# Patient Record
Sex: Female | Born: 1965 | Race: White | Hispanic: No | Marital: Married | Smoking: Never smoker
Health system: Southern US, Community
[De-identification: ages and names within clinical notes are randomized; demographics above are authoritative.]

## PROBLEM LIST (undated history)

## (undated) DIAGNOSIS — T7840XA Allergy, unspecified, initial encounter: Secondary | ICD-10-CM

## (undated) DIAGNOSIS — E079 Disorder of thyroid, unspecified: Secondary | ICD-10-CM

## (undated) HISTORY — DX: Allergy, unspecified, initial encounter: T78.40XA

## (undated) HISTORY — PX: THYROIDECTOMY, PARTIAL: SHX18

## (undated) HISTORY — DX: Disorder of thyroid, unspecified: E07.9

## (undated) HISTORY — PX: DILATION AND CURETTAGE OF UTERUS: SHX78

---

## 1989-10-23 HISTORY — PX: THYROIDECTOMY, PARTIAL: SHX18

## 1998-11-01 ENCOUNTER — Ambulatory Visit (HOSPITAL_COMMUNITY): Admission: RE | Admit: 1998-11-01 | Discharge: 1998-11-01 | Payer: Self-pay | Admitting: Obstetrics and Gynecology

## 1999-02-03 ENCOUNTER — Other Ambulatory Visit: Admission: RE | Admit: 1999-02-03 | Discharge: 1999-02-03 | Payer: Self-pay | Admitting: Obstetrics and Gynecology

## 1999-06-13 ENCOUNTER — Inpatient Hospital Stay (HOSPITAL_COMMUNITY): Admission: AD | Admit: 1999-06-13 | Discharge: 1999-06-17 | Payer: Self-pay | Admitting: Obstetrics and Gynecology

## 1999-06-16 ENCOUNTER — Encounter: Payer: Self-pay | Admitting: Obstetrics and Gynecology

## 1999-08-23 ENCOUNTER — Inpatient Hospital Stay (HOSPITAL_COMMUNITY): Admission: AD | Admit: 1999-08-23 | Discharge: 1999-08-24 | Payer: Self-pay | Admitting: Obstetrics and Gynecology

## 1999-09-28 ENCOUNTER — Other Ambulatory Visit: Admission: RE | Admit: 1999-09-28 | Discharge: 1999-09-28 | Payer: Self-pay | Admitting: Obstetrics and Gynecology

## 2004-08-25 ENCOUNTER — Other Ambulatory Visit: Admission: RE | Admit: 2004-08-25 | Discharge: 2004-08-25 | Payer: Self-pay | Admitting: Family Medicine

## 2005-09-12 ENCOUNTER — Other Ambulatory Visit: Admission: RE | Admit: 2005-09-12 | Discharge: 2005-09-12 | Payer: Self-pay | Admitting: Family Medicine

## 2006-09-24 ENCOUNTER — Other Ambulatory Visit: Admission: RE | Admit: 2006-09-24 | Discharge: 2006-09-24 | Payer: Self-pay | Admitting: Family Medicine

## 2011-01-19 LAB — HM MAMMOGRAPHY: HM Mammogram: NORMAL

## 2011-01-26 LAB — HM PAP SMEAR: HM Pap smear: ABNORMAL

## 2011-02-01 ENCOUNTER — Encounter: Payer: Self-pay | Admitting: *Deleted

## 2013-05-05 ENCOUNTER — Other Ambulatory Visit (INDEPENDENT_AMBULATORY_CARE_PROVIDER_SITE_OTHER): Payer: BC Managed Care – PPO

## 2013-05-05 DIAGNOSIS — E039 Hypothyroidism, unspecified: Secondary | ICD-10-CM

## 2013-05-05 NOTE — Progress Notes (Signed)
Patient came in for labs only.

## 2013-05-06 ENCOUNTER — Other Ambulatory Visit: Payer: Self-pay

## 2013-05-06 LAB — THYROID PANEL WITH TSH
Free Thyroxine Index: 3.5 (ref 1.0–3.9)
T3 Uptake: 27.7 % (ref 22.5–37.0)
T4, Total: 12.8 ug/dL — ABNORMAL HIGH (ref 5.0–12.5)
TSH: 1.464 u[IU]/mL (ref 0.350–4.500)

## 2013-05-22 ENCOUNTER — Encounter: Payer: Self-pay | Admitting: Nurse Practitioner

## 2013-05-22 ENCOUNTER — Ambulatory Visit (INDEPENDENT_AMBULATORY_CARE_PROVIDER_SITE_OTHER): Payer: BC Managed Care – PPO | Admitting: Nurse Practitioner

## 2013-05-22 VITALS — BP 109/72 | HR 78 | Temp 97.3°F | Ht 65.5 in | Wt 187.0 lb

## 2013-05-22 DIAGNOSIS — E039 Hypothyroidism, unspecified: Secondary | ICD-10-CM

## 2013-05-22 MED ORDER — LEVOTHYROXINE SODIUM 100 MCG PO TABS: 100.0000 ug | ORAL_TABLET | Freq: Every day | ORAL | Status: DC

## 2013-05-22 NOTE — Progress Notes (Signed)
  Subjective:    Patient ID: Leah May, female    DOB: [birth year], 47 y.o.   MRN: [id number]  Thyroid Problem Presents for follow-up (hypothyroidism) visit. Patient reports no anxiety, constipation, depressed mood, diaphoresis, diarrhea, dry skin, hair loss, heat intolerance, menstrual problem, nail problem, visual change, weight gain or weight loss. The symptoms have been stable.      Review of Systems  Constitutional: Negative for weight loss, weight gain and diaphoresis.  Gastrointestinal: Negative for diarrhea and constipation.  Endocrine: Negative for heat intolerance.  Genitourinary: Negative for menstrual problem.  All other systems reviewed and are negative.       Objective:   Physical Exam  Constitutional: She is oriented to person, place, and time. She appears well-developed and well-nourished.  HENT:  Nose: Nose normal.  Mouth/Throat: Oropharynx is clear and moist.  Eyes: EOM are normal.  Neck: Trachea normal, normal range of motion and full passive range of motion without pain. Neck supple. No JVD present. Carotid bruit is not present. No thyromegaly present.  Cardiovascular: Normal rate, regular rhythm, normal heart sounds and intact distal pulses.  Exam reveals no gallop and no friction rub.   No murmur heard. Pulmonary/Chest: Effort normal and breath sounds normal.  Abdominal: Soft. Bowel sounds are normal. She exhibits no distension and no mass. There is no tenderness.  Musculoskeletal: Normal range of motion.  Lymphadenopathy:    She has no cervical adenopathy.  Neurological: She is alert and oriented to person, place, and time. She has normal reflexes.  Skin: Skin is warm and dry.  Psychiatric: She has a normal mood and affect. Her behavior is normal. Judgment and thought content normal.    BP 109/72  Pulse 78  Temp(Src) 97.3 F (36.3 C) (Oral)  Ht 5' 5.5" (1.664 m)  Wt 187 lb (84.823 kg)  BMI 30.63 kg/m2  Results for orders placed in visit on  05/05/13  THYROID PANEL WITH TSH      Result Value Range   T4, Total 12.8 (*) 5.0 - 12.5 ug/dL   T3 Uptake 11.9  14.7 - 37.0 %   Free Thyroxine Index 3.5  1.0 - 3.9   TSH 1.464  0.350 - 4.500 uIU/mL        Assessment & Plan:  1. Hypothyroidism Continue current meds Diet and exercise Follow up in 6 months  Mary-Margaret Daphine Deutscher, FNP

## 2013-05-22 NOTE — Addendum Note (Signed)
Addended by: Bennie Pierini on: 05/22/2013 11:46 AM   Modules accepted: Orders

## 2013-05-22 NOTE — Patient Instructions (Signed)

## 2013-05-26 ENCOUNTER — Encounter: Payer: Self-pay | Admitting: *Deleted

## 2013-07-09 ENCOUNTER — Encounter: Payer: Self-pay | Admitting: Nurse Practitioner

## 2013-07-09 ENCOUNTER — Ambulatory Visit (INDEPENDENT_AMBULATORY_CARE_PROVIDER_SITE_OTHER): Payer: BC Managed Care – PPO | Admitting: Nurse Practitioner

## 2013-07-09 VITALS — BP 114/72 | HR 91 | Temp 99.2°F | Ht 65.5 in | Wt 180.5 lb

## 2013-07-09 DIAGNOSIS — Z0000 Encounter for general adult medical examination without abnormal findings: Secondary | ICD-10-CM

## 2013-07-09 DIAGNOSIS — Z01419 Encounter for gynecological examination (general) (routine) without abnormal findings: Secondary | ICD-10-CM

## 2013-07-09 NOTE — Patient Instructions (Signed)

## 2013-07-09 NOTE — Progress Notes (Signed)
  Subjective:    Patient ID: Leah May, female    DOB: Dec 04, 1965, 47 y.o.   MRN: [id number]  HPI  Patient in today for CPE and PAP- she is doing quite well . Only current medical problem is Hypothyroidism an d she is doing well on current dose.    Review of Systems  All other systems reviewed and are negative.       Objective:   Physical Exam  Constitutional: She is oriented to person, place, and time. She appears well-developed and well-nourished.  HENT:  Head: Normocephalic.  Right Ear: Hearing, tympanic membrane, external ear and ear canal normal.  Left Ear: Hearing, tympanic membrane, external ear and ear canal normal.  Nose: Nose normal.  Mouth/Throat: Uvula is midline and oropharynx is clear and moist.  Eyes: Conjunctivae and EOM are normal. Pupils are equal, round, and reactive to light.  Neck: Normal range of motion and full passive range of motion without pain. Neck supple. No JVD present. Carotid bruit is not present. No mass and no thyromegaly present.  Cardiovascular: Normal rate, normal heart sounds and intact distal pulses.   No murmur heard. Pulmonary/Chest: Effort normal and breath sounds normal. Right breast exhibits no inverted nipple, no mass, no nipple discharge, no skin change and no tenderness. Left breast exhibits no inverted nipple, no mass, no nipple discharge, no skin change and no tenderness.  Abdominal: Soft. Bowel sounds are normal. She exhibits no mass. There is no tenderness.  Genitourinary: Vagina normal and uterus normal. No breast swelling, tenderness, discharge or bleeding.  bimanual exam-No adnexal masses or tenderness.  Cervix parous and pink no discharge   Musculoskeletal: Normal range of motion.  Lymphadenopathy:    She has no cervical adenopathy.  Neurological: She is alert and oriented to person, place, and time.  Skin: Skin is warm and dry.  Psychiatric: She has a normal mood and affect. Her behavior is normal. Judgment and thought  content normal.   BP 114/72  Pulse 91  Temp(Src) 99.2 F (37.3 C) (Oral)  Ht 5' 5.5" (1.664 m)  Wt 180 lb 8 oz (81.874 kg)  BMI 29.57 kg/m2        Assessment & Plan:  1. Annual physical exam Low fat diet and exercise encouraged  2. Encounter for routine gynecological examination - Pap IG w/ reflex to HPV when ASC-U  Continue current meds Labs reviewed at appointment  Mary-Margaret Daphine Deutscher, FNP

## 2013-07-14 LAB — PAP IG W/ RFLX HPV ASCU: PAP Smear Comment: 0

## 2013-07-25 ENCOUNTER — Telehealth: Payer: Self-pay | Admitting: Nurse Practitioner

## 2013-07-25 NOTE — Telephone Encounter (Signed)
Pt aware.

## 2013-07-28 ENCOUNTER — Ambulatory Visit (INDEPENDENT_AMBULATORY_CARE_PROVIDER_SITE_OTHER): Payer: BC Managed Care – PPO

## 2013-07-28 DIAGNOSIS — Z23 Encounter for immunization: Secondary | ICD-10-CM

## 2013-08-21 ENCOUNTER — Other Ambulatory Visit: Payer: Self-pay | Admitting: Nurse Practitioner

## 2013-08-21 DIAGNOSIS — R928 Other abnormal and inconclusive findings on diagnostic imaging of breast: Secondary | ICD-10-CM

## 2014-05-13 ENCOUNTER — Other Ambulatory Visit (INDEPENDENT_AMBULATORY_CARE_PROVIDER_SITE_OTHER): Payer: BC Managed Care – PPO

## 2014-05-13 DIAGNOSIS — E039 Hypothyroidism, unspecified: Secondary | ICD-10-CM

## 2014-05-14 LAB — THYROID PANEL WITH TSH
Free Thyroxine Index: 2.3 (ref 1.2–4.9)
T3 Uptake Ratio: 18 % — ABNORMAL LOW (ref 24–39)
T4, Total: 13 ug/dL — ABNORMAL HIGH (ref 4.5–12.0)
TSH: 1.78 u[IU]/mL (ref 0.450–4.500)

## 2014-05-15 ENCOUNTER — Telehealth: Payer: Self-pay | Admitting: Family Medicine

## 2014-05-18 NOTE — Telephone Encounter (Signed)
labs

## 2014-06-23 ENCOUNTER — Other Ambulatory Visit: Payer: Self-pay | Admitting: *Deleted

## 2014-06-23 MED ORDER — LEVOTHYROXINE SODIUM 100 MCG PO TABS: 100.0000 ug | ORAL_TABLET | Freq: Every day | ORAL | Status: DC

## 2014-07-15 ENCOUNTER — Other Ambulatory Visit: Payer: BC Managed Care – PPO | Admitting: Nurse Practitioner

## 2014-07-29 ENCOUNTER — Telehealth: Payer: Self-pay | Admitting: Nurse Practitioner

## 2014-07-29 NOTE — Telephone Encounter (Signed)
ntbs for refills of meds

## 2014-07-29 NOTE — Telephone Encounter (Signed)
Please advise 

## 2014-07-29 NOTE — Telephone Encounter (Signed)
Patient requesting that meds be sent to [facility]. Has not had any labs or been seen since 07-09-13. Please advise on 90 day supply

## 2014-07-29 NOTE — Telephone Encounter (Signed)
error 

## 2014-07-30 ENCOUNTER — Telehealth: Payer: Self-pay | Admitting: *Deleted

## 2014-07-30 NOTE — Telephone Encounter (Signed)
Message given,need to schedule an appointment for check up and medication refills.  New scripts can be sent to mail order at that time.

## 2014-08-07 ENCOUNTER — Other Ambulatory Visit: Payer: Self-pay

## 2014-08-20 ENCOUNTER — Ambulatory Visit: Payer: BC Managed Care – PPO

## 2014-09-15 ENCOUNTER — Telehealth: Payer: Self-pay | Admitting: Nurse Practitioner

## 2014-09-16 NOTE — Telephone Encounter (Signed)
Brand name

## 2014-09-18 MED ORDER — LEVOTHYROXINE SODIUM 100 MCG PO TABS: 100.0000 ug | ORAL_TABLET | Freq: Every day | ORAL | Status: DC

## 2014-09-25 ENCOUNTER — Other Ambulatory Visit: Payer: Self-pay | Admitting: *Deleted

## 2014-09-25 MED ORDER — LEVOTHYROXINE SODIUM 100 MCG PO TABS: 100.0000 ug | ORAL_TABLET | Freq: Every day | ORAL | Status: DC

## 2014-10-01 ENCOUNTER — Ambulatory Visit (INDEPENDENT_AMBULATORY_CARE_PROVIDER_SITE_OTHER): Payer: BC Managed Care – PPO | Admitting: Nurse Practitioner

## 2014-10-01 ENCOUNTER — Encounter: Payer: Self-pay | Admitting: Nurse Practitioner

## 2014-10-01 VITALS — BP 131/83 | HR 80 | Temp 97.1°F | Ht 65.0 in | Wt 188.0 lb

## 2014-10-01 DIAGNOSIS — E039 Hypothyroidism, unspecified: Secondary | ICD-10-CM

## 2014-10-01 MED ORDER — LEVOTHYROXINE SODIUM 100 MCG PO TABS: 100.0000 ug | ORAL_TABLET | Freq: Every day | ORAL | Status: DC

## 2014-10-01 NOTE — Progress Notes (Signed)
  Subjective:    Patient ID: Leah May, female    DOB: June 04, 1966, 48 y.o.   MRN: [id number]  Thyroid Problem Presents for follow-up visit. Patient reports no constipation, diaphoresis, diarrhea, heat intolerance, menstrual problem or visual change. The symptoms have been stable.      Review of Systems  Constitutional: Negative for diaphoresis.  Respiratory: Negative.   Cardiovascular: Negative.   Gastrointestinal: Negative for diarrhea and constipation.  Endocrine: Negative for heat intolerance.  Genitourinary: Negative for menstrual problem.  Neurological: Negative.   Psychiatric/Behavioral: Negative.   All other systems reviewed and are negative.      Objective:   Physical Exam  Constitutional: She is oriented to person, place, and time. She appears well-developed and well-nourished.  HENT:  Nose: Nose normal.  Mouth/Throat: Oropharynx is clear and moist.  Eyes: EOM are normal.  Neck: Trachea normal, normal range of motion and full passive range of motion without pain. Neck supple. No JVD present. Carotid bruit is not present. No thyromegaly present.  Cardiovascular: Normal rate, regular rhythm, normal heart sounds and intact distal pulses.  Exam reveals no gallop and no friction rub.   No murmur heard. Pulmonary/Chest: Effort normal and breath sounds normal.  Abdominal: Soft. Bowel sounds are normal. She exhibits no distension and no mass. There is no tenderness.  Musculoskeletal: Normal range of motion.  Lymphadenopathy:    She has no cervical adenopathy.  Neurological: She is alert and oriented to person, place, and time. She has normal reflexes.  Skin: Skin is warm and dry.  Psychiatric: She has a normal mood and affect. Her behavior is normal. Judgment and thought content normal.    BP 131/83 mmHg  Pulse 80  Temp(Src) 97.1 F (36.2 C) (Oral)  Ht 5\' 5"  (1.651 m)  Wt 188 lb (85.276 kg)  BMI 31.28 kg/m2        Assessment & Plan:  1.  Hypothyroidism Continue current meds Diet and exercise Follow up in 6 months  Mary-Margaret Hassell Done, FNP

## 2014-10-01 NOTE — Patient Instructions (Signed)
Hypothyroidism The thyroid is a large gland located in the lower front of your neck. The thyroid gland helps control metabolism. Metabolism is how your body handles food. It controls metabolism with the hormone thyroxine. When this gland is underactive (hypothyroid), it produces too little hormone.  CAUSES These include:   Absence or destruction of thyroid tissue.  Goiter due to iodine deficiency.  Goiter due to medications.  Congenital defects (since birth).  Problems with the pituitary. This causes a lack of TSH (thyroid stimulating hormone). This hormone tells the thyroid to turn out more hormone. SYMPTOMS  Lethargy (feeling as though you have no energy)  Cold intolerance  Weight gain (in spite of normal food intake)  Dry skin  Coarse hair  Menstrual irregularity (if severe, may lead to infertility)  Slowing of thought processes Cardiac problems are also caused by insufficient amounts of thyroid hormone. Hypothyroidism in the newborn is cretinism, and is an extreme form. It is important that this form be treated adequately and immediately or it will lead rapidly to retarded physical and mental development. DIAGNOSIS  To prove hypothyroidism, your caregiver may do blood tests and ultrasound tests. Sometimes the signs are hidden. It may be necessary for your caregiver to watch this illness with blood tests either before or after diagnosis and treatment. TREATMENT  Low levels of thyroid hormone are increased by using synthetic thyroid hormone. This is a safe, effective treatment. It usually takes about four weeks to gain the full effects of the medication. After you have the full effect of the medication, it will generally take another four weeks for problems to leave. Your caregiver may start you on low doses. If you have had heart problems the dose may be gradually increased. It is generally not an emergency to get rapidly to normal. HOME CARE INSTRUCTIONS   Take your  medications as your caregiver suggests. Let your caregiver know of any medications you are taking or start taking. Your caregiver will help you with dosage schedules.  As your condition improves, your dosage needs may increase. It will be necessary to have continuing blood tests as suggested by your caregiver.  Report all suspected medication side effects to your caregiver. SEEK MEDICAL CARE IF: Seek medical care if you develop:  Sweating.  Tremulousness (tremors).  Anxiety.  Rapid weight loss.  Heat intolerance.  Emotional swings.  Diarrhea.  Weakness. SEEK IMMEDIATE MEDICAL CARE IF:  You develop chest pain, an irregular heart beat (palpitations), or a rapid heart beat. MAKE SURE YOU:   Understand these instructions.  Will watch your condition.  Will get help right away if you are not doing well or get worse. Document Released: 10/09/2005 Document Revised: 01/01/2012 Document Reviewed: 05/29/2008 Exit[employer] Patient Information 2015 [employer]. This information is not intended to replace advice given to you by your health care provider. Make sure you discuss any questions you have with your health care provider.  

## 2014-10-02 LAB — NMR, LIPOPROFILE
CHOLESTEROL: 179 mg/dL (ref 100–199)
HDL CHOLESTEROL BY NMR: 73 mg/dL (ref >39)
HDL PARTICLE NUMBER: 38.1 umol/L (ref >=30.5)
LDL Particle Number: 804 nmol/L (ref <1000)
LDL Size: 20.7 nm (ref >20.5)
LDL-C: 93 mg/dL (ref 0–99)
LP-IR Score: <25 (ref <=45)
SMALL LDL PARTICLE NUMBER: 232 nmol/L (ref <=527)
TRIGLYCERIDES BY NMR: 63 mg/dL (ref 0–149)

## 2014-10-02 LAB — CMP14+EGFR
A/G RATIO: 1.7 (ref 1.1–2.5)
ALBUMIN: 4.3 g/dL (ref 3.5–5.5)
ALT: 17 IU/L (ref 0–32)
AST: 18 IU/L (ref 0–40)
Alkaline Phosphatase: 57 IU/L (ref 39–117)
BUN/Creatinine Ratio: 22 (ref 9–23)
BUN: 13 mg/dL (ref 6–24)
CO2: 22 mmol/L (ref 18–29)
CREATININE: 0.58 mg/dL (ref 0.57–1.00)
Calcium: 10.5 mg/dL — ABNORMAL HIGH (ref 8.7–10.2)
Chloride: 100 mmol/L (ref 97–108)
GFR calc Af Amer: 128 mL/min/{1.73_m2} (ref >59)
GFR, EST NON AFRICAN AMERICAN: 111 mL/min/{1.73_m2} (ref >59)
GLOBULIN, TOTAL: 2.5 g/dL (ref 1.5–4.5)
GLUCOSE: 101 mg/dL — AB (ref 65–99)
Potassium: 4.1 mmol/L (ref 3.5–5.2)
Sodium: 138 mmol/L (ref 134–144)
TOTAL PROTEIN: 6.8 g/dL (ref 6.0–8.5)
Total Bilirubin: 0.4 mg/dL (ref 0.0–1.2)

## 2015-09-06 ENCOUNTER — Ambulatory Visit (INDEPENDENT_AMBULATORY_CARE_PROVIDER_SITE_OTHER): Payer: BC Managed Care – PPO

## 2015-09-06 ENCOUNTER — Other Ambulatory Visit: Payer: Self-pay | Admitting: Family Medicine

## 2015-09-06 ENCOUNTER — Ambulatory Visit (INDEPENDENT_AMBULATORY_CARE_PROVIDER_SITE_OTHER): Payer: BC Managed Care – PPO | Admitting: Family Medicine

## 2015-09-06 ENCOUNTER — Encounter: Payer: Self-pay | Admitting: Family Medicine

## 2015-09-06 VITALS — BP 121/75 | HR 87 | Temp 99.4°F | Ht 65.0 in | Wt 185.4 lb

## 2015-09-06 DIAGNOSIS — R05 Cough: Secondary | ICD-10-CM

## 2015-09-06 DIAGNOSIS — R0781 Pleurodynia: Secondary | ICD-10-CM | POA: Diagnosis not present

## 2015-09-06 DIAGNOSIS — R059 Cough, unspecified: Secondary | ICD-10-CM

## 2015-09-06 NOTE — Progress Notes (Signed)
BP 121/75 mmHg  Pulse 87  Temp(Src) 99.4 F (37.4 C) (Oral)  Ht 5' 5"  (1.651 m)  Wt 185 lb 6.4 oz (84.097 kg)  BMI 30.85 kg/m2  LMP 08/30/2015 (Approximate)   Subjective:    Patient ID: Leah May, female    DOB: [birth year], 49 y.o.   MRN: [id number]  HPI: Leah May is a 49 y.o. female presenting on 09/06/2015 for Hurting in chest and Cough   HPI Left-sided chest pain Patient has left-sided chest pain with a deep breathing and coughing. He pain has persisted since she has recently gotten over an acute bronchitis. The pain is felt and worsened upon palpation.She denies any shortness of breath. She has residual cough from her current illness but it is improving and almost gone. She has been using Claritin and azelastine spray. They have been helping but not completely getting rid of her postnasal drainage. She denies fevers or chills.  Relevant past medical, surgical, family and social history reviewed and updated as indicated. Interim medical history since our last visit reviewed. Allergies and medications reviewed and updated.  Review of Systems  Constitutional: Negative for fever and chills.  HENT: Positive for postnasal drip and sinus pressure. Negative for congestion, ear discharge, ear pain, rhinorrhea, sneezing and sore throat.   Eyes: Negative for pain, redness and visual disturbance.  Respiratory: Positive for cough. Negative for chest tightness and shortness of breath.   Cardiovascular: Negative for chest pain and leg swelling.  Genitourinary: Negative for dysuria and difficulty urinating.  Musculoskeletal: Negative for back pain and gait problem.  Skin: Negative for rash.  Neurological: Negative for light-headedness and headaches.  Psychiatric/Behavioral: Negative for behavioral problems and agitation.  All other systems reviewed and are negative.   Per HPI unless specifically indicated above     Medication List       This list is accurate as of:  09/06/15  5:30 PM.  Always use your most recent med list.               levothyroxine 100 MCG tablet  Commonly known as:  SYNTHROID, LEVOTHROID  Take 1 tablet (100 mcg total) by mouth daily.           Objective:    BP 121/75 mmHg  Pulse 87  Temp(Src) 99.4 F (37.4 C) (Oral)  Ht 5' 5"  (1.651 m)  Wt 185 lb 6.4 oz (84.097 kg)  BMI 30.85 kg/m2  LMP 08/30/2015 (Approximate)  Wt Readings from Last 3 Encounters:  09/06/15 185 lb 6.4 oz (84.097 kg)  10/01/14 188 lb (85.276 kg)  07/09/13 180 lb 8 oz (81.874 kg)    Physical Exam  Constitutional: She is oriented to person, place, and time. She appears well-developed and well-nourished. No distress.  HENT:  Right Ear: Tympanic membrane, external ear and ear canal normal.  Left Ear: Tympanic membrane, external ear and ear canal normal.  Nose: Mucosal edema and rhinorrhea present. No epistaxis. Right sinus exhibits no maxillary sinus tenderness and no frontal sinus tenderness. Left sinus exhibits no maxillary sinus tenderness and no frontal sinus tenderness.  Mouth/Throat: Uvula is midline and mucous membranes are normal. Posterior oropharyngeal edema and posterior oropharyngeal erythema present. No oropharyngeal exudate or tonsillar abscesses.  Eyes: Conjunctivae and EOM are normal. Pupils are equal, round, and reactive to light.  Neck: Neck supple. No thyromegaly present.  Cardiovascular: Normal rate, regular rhythm, normal heart sounds and intact distal pulses.   No murmur heard. Pulmonary/Chest: Effort normal and breath  sounds normal. No respiratory distress. She has no wheezes.  Musculoskeletal: Normal range of motion. She exhibits tenderness. She exhibits no edema.       Arms: Lymphadenopathy:    She has no cervical adenopathy.  Neurological: She is alert and oriented to person, place, and time. Coordination normal.  Skin: Skin is warm and dry. No rash noted. She is not diaphoretic.  Psychiatric: She has a normal mood and  affect. Her behavior is normal.  Nursing note and vitals reviewed.   Results for orders placed or performed in visit on 10/01/14  CMP14+EGFR  Result Value Ref Range   Glucose 101 (H) 65 - 99 mg/dL   BUN 13 6 - 24 mg/dL   Creatinine, Ser 0.58 0.57 - 1.00 mg/dL   GFR calc non Af Amer 111 >59 mL/min/1.73   GFR calc Af Amer 128 >59 mL/min/1.73   BUN/Creatinine Ratio 22 9 - 23   Sodium 138 134 - 144 mmol/L   Potassium 4.1 3.5 - 5.2 mmol/L   Chloride 100 97 - 108 mmol/L   CO2 22 18 - 29 mmol/L   Calcium 10.5 (H) 8.7 - 10.2 mg/dL   Total Protein 6.8 6.0 - 8.5 g/dL   Albumin 4.3 3.5 - 5.5 g/dL   Globulin, Total 2.5 1.5 - 4.5 g/dL   Albumin/Globulin Ratio 1.7 1.1 - 2.5   Total Bilirubin 0.4 0.0 - 1.2 mg/dL   Alkaline Phosphatase 57 39 - 117 IU/L   AST 18 0 - 40 IU/L   ALT 17 0 - 32 IU/L  NMR, lipoprofile  Result Value Ref Range   LDL Particle Number [id number] <1000 nmol/L   LDL-C 93 0 - 99 mg/dL   HDL Cholesterol by NMR 73 >39 mg/dL   Triglycerides by NMR 63 0 - 149 mg/dL   Cholesterol 179 100 - 199 mg/dL   HDL Particle Number 38.1 >=30.5 umol/L   Small LDL Particle Number 232 <=527 nmol/L   LDL Size 20.7 >20.5 nm   LP-IR Score <25 <=45      Assessment & Plan:   Problem List Items Addressed This Visit    None    Visit Diagnoses    Rib pain on left side    -  Primary    Patient has been having cough and has left-sided sharp rib pain. Recommended anti-inflammatories and massage with tennis ball        Follow up plan: Return if symptoms worsen or fail to improve.  Counseling provided for all of the vaccine components No orders of the defined types were placed in this encounter.    Caryl Pina, MD [facility] 09/06/2015, 5:30 PM

## 2015-09-14 ENCOUNTER — Ambulatory Visit (INDEPENDENT_AMBULATORY_CARE_PROVIDER_SITE_OTHER): Payer: BC Managed Care – PPO | Admitting: *Deleted

## 2015-09-14 DIAGNOSIS — Z23 Encounter for immunization: Secondary | ICD-10-CM

## 2015-09-19 ENCOUNTER — Other Ambulatory Visit: Payer: Self-pay | Admitting: Nurse Practitioner

## 2015-09-20 NOTE — Telephone Encounter (Signed)
Last refill without being seen 

## 2015-09-20 NOTE — Telephone Encounter (Signed)
Last TSH 04/2014 

## 2015-09-20 NOTE — Telephone Encounter (Signed)
Detailed message left for patient that it is time for her to be seen.

## 2015-10-29 LAB — HM MAMMOGRAPHY

## 2015-11-01 ENCOUNTER — Other Ambulatory Visit: Payer: BC Managed Care – PPO | Admitting: Nurse Practitioner

## 2015-11-09 ENCOUNTER — Encounter: Payer: Self-pay | Admitting: Nurse Practitioner

## 2015-11-09 ENCOUNTER — Ambulatory Visit (INDEPENDENT_AMBULATORY_CARE_PROVIDER_SITE_OTHER): Payer: BC Managed Care – PPO | Admitting: Nurse Practitioner

## 2015-11-09 VITALS — BP 137/90 | HR 91 | Temp 97.7°F | Ht 65.0 in | Wt 195.0 lb

## 2015-11-09 DIAGNOSIS — E039 Hypothyroidism, unspecified: Secondary | ICD-10-CM

## 2015-11-09 DIAGNOSIS — Z01419 Encounter for gynecological examination (general) (routine) without abnormal findings: Secondary | ICD-10-CM | POA: Diagnosis not present

## 2015-11-09 DIAGNOSIS — Z0000 Encounter for general adult medical examination without abnormal findings: Secondary | ICD-10-CM

## 2015-11-09 DIAGNOSIS — Z6832 Body mass index (BMI) 32.0-32.9, adult: Secondary | ICD-10-CM | POA: Diagnosis not present

## 2015-11-09 LAB — POCT URINALYSIS DIPSTICK
Bilirubin, UA: NEGATIVE
GLUCOSE UA: NEGATIVE
KETONES UA: NEGATIVE
LEUKOCYTES UA: NEGATIVE
NITRITE UA: NEGATIVE
Protein, UA: NEGATIVE
Urobilinogen, UA: NEGATIVE
pH, UA: 5

## 2015-11-09 MED ORDER — LEVOTHYROXINE SODIUM 100 MCG PO TABS: 100.0000 ug | ORAL_TABLET | Freq: Every day | ORAL | Status: DC

## 2015-11-09 MED ORDER — PHENTERMINE HCL 37.5 MG PO CAPS: 37.5000 mg | ORAL_CAPSULE | ORAL | Status: DC

## 2015-11-09 NOTE — Patient Instructions (Signed)

## 2015-11-09 NOTE — Progress Notes (Signed)
Subjective:    Patient ID: Leah May, female    DOB: 02/02/1966, 50 y.o.   MRN: [id number]   Patient here today for annual physical and  follow up of chronic medical problems.  Outpatient Encounter Prescriptions as of 11/09/2015  Medication Sig  . levothyroxine (SYNTHROID, LEVOTHROID) 100 MCG tablet TAKE 1 TABLET DAILY   No facility-administered encounter medications on file as of 11/09/2015.     Thyroid Problem Presents for follow-up (hypothyroidism- patient currently onlevothyroxine 137mg daily.) visit. Patient reports no anxiety, depressed mood, dry skin, fatigue, hair loss or heat intolerance. The symptoms have been stable.       Review of Systems  Constitutional: Negative for fatigue.  HENT: Negative.   Respiratory: Negative.   Cardiovascular: Negative.   Endocrine: Negative for heat intolerance.  Genitourinary: Negative.   Musculoskeletal: Negative.   Neurological: Negative.   Psychiatric/Behavioral: Negative.  The patient is not nervous/anxious.        Objective:   Physical Exam  Constitutional: She is oriented to person, place, and time. She appears well-developed and well-nourished.  HENT:  Head: Normocephalic.  Right Ear: Hearing, tympanic membrane, external ear and ear canal normal.  Left Ear: Hearing, tympanic membrane, external ear and ear canal normal.  Nose: Nose normal.  Mouth/Throat: Uvula is midline and oropharynx is clear and moist.  Eyes: Conjunctivae and EOM are normal. Pupils are equal, round, and reactive to light.  Neck: Normal range of motion and full passive range of motion without pain. Neck supple. No JVD present. Carotid bruit is not present. No thyroid mass and no thyromegaly present.  Cardiovascular: Normal rate, normal heart sounds and intact distal pulses.   No murmur heard. Pulmonary/Chest: Effort normal and breath sounds normal. Right breast exhibits no inverted nipple, no mass, no nipple discharge, no skin change and no  tenderness. Left breast exhibits no inverted nipple, no mass, no nipple discharge, no skin change and no tenderness.  Abdominal: Soft. Bowel sounds are normal. She exhibits no mass. There is no tenderness.  Genitourinary: Vagina normal and uterus normal. No breast swelling, tenderness, discharge or bleeding.  bimanual exam-No adnexal masses or tenderness. Cervix parous and pink - no vaginal discharge.   Musculoskeletal: Normal range of motion.  Lymphadenopathy:    She has no cervical adenopathy.  Neurological: She is alert and oriented to person, place, and time.  Skin: Skin is warm and dry.  Psychiatric: She has a normal mood and affect. Her behavior is normal. Judgment and thought content normal.   BP 137/90 mmHg  Pulse 91  Temp(Src) 97.7 F (36.5 C) (Oral)  Ht '5\' 5"'$  (1.651 m)  Wt 195 lb (88.451 kg)  BMI 32.45 kg/m2        Assessment & Plan:  1. Annual physical exam - POCT urinalysis dipstick - CMP14+EGFR - Lipid panel - CBC with Differential/Platelet - Thyroid Panel With TSH  2. Encounter for routine gynecological examination - Pap IG w/ reflex to HPV when ASC-U  3. Hypothyroidism, unspecified hypothyroidism type - levothyroxine (SYNTHROID, LEVOTHROID) 100 MCG tablet; Take 1 tablet (100 mcg total) by mouth daily.  Dispense: 90 tablet; Refill: 1  4. BMI 32.0-32.9,adult Discussed diet and exercise for person with BMI >25 Will recheck weight in 3-6 months - phentermine 37.5 MG capsule; Take 1 capsule (37.5 mg total) by mouth every morning.  Dispense: 30 capsule; Refill: 2    Labs pending Health maintenance reviewed Diet and exercise encouraged Continue all meds Follow up  In 6  months   Leah Christasia Angeletti, FNP    

## 2015-11-10 LAB — CMP14+EGFR
A/G RATIO: 2 (ref 1.1–2.5)
ALT: 13 IU/L (ref 0–32)
AST: 15 IU/L (ref 0–40)
Albumin: 4.6 g/dL (ref 3.5–5.5)
Alkaline Phosphatase: 61 IU/L (ref 39–117)
BUN/Creatinine Ratio: 35 — ABNORMAL HIGH (ref 9–23)
BUN: 16 mg/dL (ref 6–24)
CALCIUM: 9.5 mg/dL (ref 8.7–10.2)
CO2: 20 mmol/L (ref 18–29)
Chloride: 104 mmol/L (ref 96–106)
Creatinine, Ser: 0.46 mg/dL — ABNORMAL LOW (ref 0.57–1.00)
GFR calc Af Amer: 137 mL/min/{1.73_m2} (ref >59)
GFR, EST NON AFRICAN AMERICAN: 119 mL/min/{1.73_m2} (ref >59)
GLOBULIN, TOTAL: 2.3 g/dL (ref 1.5–4.5)
Glucose: 115 mg/dL — ABNORMAL HIGH (ref 65–99)
Potassium: 4 mmol/L (ref 3.5–5.2)
SODIUM: 141 mmol/L (ref 134–144)
Total Protein: 6.9 g/dL (ref 6.0–8.5)

## 2015-11-10 LAB — CBC WITH DIFFERENTIAL/PLATELET
BASOS: 1 %
Basophils Absolute: 0.1 10*3/uL (ref 0.0–0.2)
EOS (ABSOLUTE): 0.2 10*3/uL (ref 0.0–0.4)
EOS: 2 %
HEMATOCRIT: 35.9 % (ref 34.0–46.6)
Hemoglobin: 12.5 g/dL (ref 11.1–15.9)
IMMATURE GRANULOCYTES: 1 %
Immature Grans (Abs): 0.1 10*3/uL (ref 0.0–0.1)
LYMPHS ABS: 3 10*3/uL (ref 0.7–3.1)
Lymphs: 31 %
MCH: 29.6 pg (ref 26.6–33.0)
MCHC: 34.8 g/dL (ref 31.5–35.7)
MCV: 85 fL (ref 79–97)
MONOS ABS: 0.7 10*3/uL (ref 0.1–0.9)
Monocytes: 7 %
NEUTROS PCT: 58 %
Neutrophils Absolute: 5.8 10*3/uL (ref 1.4–7.0)
PLATELETS: 336 10*3/uL (ref 150–379)
RBC: 4.23 x10E6/uL (ref 3.77–5.28)
RDW: 13.5 % (ref 12.3–15.4)
WBC: 9.8 10*3/uL (ref 3.4–10.8)

## 2015-11-10 LAB — LIPID PANEL
CHOLESTEROL TOTAL: 200 mg/dL — AB (ref 100–199)
Chol/HDL Ratio: 2.2 ratio units (ref 0.0–4.4)
HDL: 89 mg/dL (ref >39)
LDL CALC: 91 mg/dL (ref 0–99)
Triglycerides: 101 mg/dL (ref 0–149)
VLDL CHOLESTEROL CAL: 20 mg/dL (ref 5–40)

## 2015-11-10 LAB — THYROID PANEL WITH TSH
Free Thyroxine Index: 2.6 (ref 1.2–4.9)
T3 UPTAKE RATIO: 19 % — AB (ref 24–39)
T4, Total: 13.6 ug/dL — ABNORMAL HIGH (ref 4.5–12.0)
TSH: 2.04 u[IU]/mL (ref 0.450–4.500)

## 2015-11-13 LAB — PAP IG W/ RFLX HPV ASCU: PAP Smear Comment: 0

## 2015-11-15 ENCOUNTER — Encounter: Payer: Self-pay | Admitting: *Deleted

## 2015-11-15 ENCOUNTER — Encounter: Payer: Self-pay | Admitting: Nurse Practitioner

## 2016-04-18 ENCOUNTER — Other Ambulatory Visit: Payer: Self-pay | Admitting: Nurse Practitioner

## 2016-09-28 ENCOUNTER — Ambulatory Visit (INDEPENDENT_AMBULATORY_CARE_PROVIDER_SITE_OTHER): Payer: BC Managed Care – PPO | Admitting: Nurse Practitioner

## 2016-09-28 ENCOUNTER — Encounter: Payer: Self-pay | Admitting: Nurse Practitioner

## 2016-09-28 VITALS — BP 127/82 | HR 82 | Temp 97.5°F | Ht 65.0 in | Wt 189.0 lb

## 2016-09-28 DIAGNOSIS — Z6834 Body mass index (BMI) 34.0-34.9, adult: Secondary | ICD-10-CM | POA: Diagnosis not present

## 2016-09-28 DIAGNOSIS — Z0000 Encounter for general adult medical examination without abnormal findings: Secondary | ICD-10-CM

## 2016-09-28 DIAGNOSIS — F5101 Primary insomnia: Secondary | ICD-10-CM

## 2016-09-28 DIAGNOSIS — E039 Hypothyroidism, unspecified: Secondary | ICD-10-CM

## 2016-09-28 MED ORDER — NALTREXONE-BUPROPION HCL ER 8-90 MG PO TB12: ORAL_TABLET | ORAL | 3 refills | Status: DC

## 2016-09-28 MED ORDER — NALTREXONE-BUPROPION HCL ER 8-90 MG PO TB12: 2.0000 | ORAL_TABLET | Freq: Two times a day (BID) | ORAL | 2 refills | Status: DC

## 2016-09-28 NOTE — Progress Notes (Signed)
Subjective:    Patient ID: Leah May, female    DOB: 08-02-1966, 50 y.o.   MRN: [id number]   Patient here today for annual physical and  follow up of chronic medical problems. No changes since last visit. No complaints today.  Outpatient Encounter Prescriptions as of 09/28/2016  Medication Sig  . levothyroxine (SYNTHROID, LEVOTHROID) 100 MCG tablet TAKE 1 TABLET DAILY    Thyroid Problem  Presents for follow-up (hypothyroidism- patient currently onlevothyroxine 120mg daily.) visit. Patient reports no anxiety, depressed mood, dry skin, fatigue, hair loss or heat intolerance. The symptoms have been stable.  insomnia Trouble staying asleep- wakes up easily and cannot fall back to sleep- only sleeps about 3 hours 3-4x a week. BMI 34 Patient concerned about her weight- she is only working part time now and is exercising and she is still putting on weight.     Review of Systems  Constitutional: Negative for fatigue.  HENT: Negative.   Respiratory: Negative.   Cardiovascular: Negative.   Endocrine: Negative for heat intolerance.  Genitourinary: Negative.   Musculoskeletal: Negative.   Neurological: Negative.   Psychiatric/Behavioral: Negative.  The patient is not nervous/anxious.        Objective:   Physical Exam  Constitutional: She is oriented to person, place, and time. She appears well-developed and well-nourished.  HENT:  Head: Normocephalic.  Right Ear: Hearing, tympanic membrane, external ear and ear canal normal.  Left Ear: Hearing, tympanic membrane, external ear and ear canal normal.  Nose: Nose normal.  Mouth/Throat: Uvula is midline and oropharynx is clear and moist.  Eyes: Conjunctivae and EOM are normal. Pupils are equal, round, and reactive to light.  Neck: Normal range of motion and full passive range of motion without pain. Neck supple. No JVD present. Carotid bruit is not present. No thyroid mass and no thyromegaly present.  Cardiovascular: Normal rate,  normal heart sounds and intact distal pulses.   No murmur heard. Pulmonary/Chest: Effort normal and breath sounds normal. Right breast exhibits no inverted nipple, no mass, no nipple discharge, no skin change and no tenderness. Left breast exhibits no inverted nipple, no mass, no nipple discharge, no skin change and no tenderness.  Abdominal: Soft. Bowel sounds are normal. She exhibits no mass. There is no tenderness.  Genitourinary: No breast swelling, tenderness, discharge or bleeding.  Musculoskeletal: Normal range of motion.  Lymphadenopathy:    She has no cervical adenopathy.  Neurological: She is alert and oriented to person, place, and time.  Skin: Skin is warm and dry.  Psychiatric: She has a normal mood and affect. Her behavior is normal. Judgment and thought content normal.   BP 127/82   Pulse 82   Temp 97.5 F (36.4 C) (Oral)   Ht 5' 5"  (1.651 m)   Wt 189 lb (85.7 kg)   BMI 31.45 kg/m      Assessment & Plan:  1. Annual physical exam - CMP14+EGFR - Lipid panel - CBC with Differential/Platelet  2. Hypothyroidism, unspecified type - Thyroid Panel With TSH  3. BMI 34.0-34.9,adult Discussed diet and exercise for person with BMI >25 Will recheck weight in 3-6 months - Naltrexone-Bupropion HCl ER (CONTRAVE) 8-90 MG TB12; 1 tablet Po Qam x7d ; then 1 tab po BID x 7 d; then 2 tab in AM and 1 in PM X7d; Then 2 po BID from then on  Dispense: 70 tablet; Refill: 3 - Naltrexone-Bupropion HCl ER (CONTRAVE) 8-90 MG TB12; Take 2 tablets by mouth 2 (two) times daily.  Dispense: 120 tablet; Refill: 2  4. Primary insomnia Bedtime routine Melatonin OTC as needed    Labs pending Health maintenance reviewed Diet and exercise encouraged Continue all meds Follow up  In 6 months  [place], FNP

## 2016-09-28 NOTE — Patient Instructions (Signed)
Exercising to Stay Healthy Introduction Exercising regularly is important. It has many health benefits, such as:  Improving your overall fitness, flexibility, and endurance.  Increasing your bone density.  Helping with weight control.  Decreasing your body fat.  Increasing your muscle strength.  Reducing stress and tension.  Improving your overall health. In order to become healthy and stay healthy, it is recommended that you do moderate-intensity and vigorous-intensity exercise. You can tell that you are exercising at a moderate intensity if you have a higher heart rate and faster breathing, but you are still able to hold a conversation. You can tell that you are exercising at a vigorous intensity if you are breathing much harder and faster and cannot hold a conversation while exercising. How often should I exercise? Choose an activity that you enjoy and set realistic goals. Your health care provider can help you to make an activity plan that works for you. Exercise regularly as directed by your health care provider. This may include:  Doing resistance training twice each week, such as:  Push-ups.  Sit-ups.  Lifting weights.  Using resistance bands.  Doing a given intensity of exercise for a given amount of time. Choose from these options:  150 minutes of moderate-intensity exercise every week.  75 minutes of vigorous-intensity exercise every week.  A mix of moderate-intensity and vigorous-intensity exercise every week. Children, pregnant women, people who are out of shape, people who are overweight, and older adults may need to consult a health care provider for individual recommendations. If you have any sort of medical condition, be sure to consult your health care provider before starting a new exercise program. What are some exercise ideas? Some moderate-intensity exercise ideas include:  Walking at a rate of 1 mile in 15  minutes.  Biking.  Hiking.  Golfing.  Dancing. Some vigorous-intensity exercise ideas include:  Walking at a rate of at least 4.5 miles per hour.  Jogging or running at a rate of 5 miles per hour.  Biking at a rate of at least 10 miles per hour.  Lap swimming.  Roller-skating or in-line skating.  Cross-country skiing.  Vigorous competitive sports, such as football, basketball, and soccer.  Jumping rope.  Aerobic dancing. What are some everyday activities that can help me to get exercise?  Yard work, such as:  Psychologist, educational.  Raking and bagging leaves.  Washing and waxing your car.  Pushing a stroller.  Shoveling snow.  Gardening.  Washing windows or floors. How can I be more active in my day-to-day activities?  Use the stairs instead of the elevator.  Take a walk during your lunch break.  If you drive, park your car farther away from work or school.  If you take public transportation, get off one stop early and walk the rest of the way.  Make all of your phone calls while standing up and walking around.  Get up, stretch, and walk around every 30 minutes throughout the day. What guidelines should I follow while exercising?  Do not exercise so much that you hurt yourself, feel dizzy, or get very short of breath.  Consult your health care provider before starting a new exercise program.  Wear comfortable clothes and shoes with good support.  Drink plenty of water while you exercise to prevent dehydration or heat stroke. Body water is lost during exercise and must be replaced.  Work out until you breathe faster and your heart beats faster. This information is not intended to replace  advice given to you by your health care provider. Make sure you discuss any questions you have with your health care provider. Document Released: 11/11/2010 Document Revised: 03/16/2016 Document Reviewed: 03/12/2014  2017 Elsevier

## 2016-09-29 LAB — CBC WITH DIFFERENTIAL/PLATELET
BASOS ABS: 0 10*3/uL (ref 0.0–0.2)
BASOS: 1 %
EOS (ABSOLUTE): 0.2 10*3/uL (ref 0.0–0.4)
Eos: 3 %
HEMOGLOBIN: 12.7 g/dL (ref 11.1–15.9)
Hematocrit: 37.3 % (ref 34.0–46.6)
IMMATURE GRANS (ABS): 0 10*3/uL (ref 0.0–0.1)
Immature Granulocytes: 0 %
LYMPHS ABS: 2.5 10*3/uL (ref 0.7–3.1)
LYMPHS: 31 %
MCH: 29.5 pg (ref 26.6–33.0)
MCHC: 34 g/dL (ref 31.5–35.7)
MCV: 87 fL (ref 79–97)
Monocytes Absolute: 0.5 10*3/uL (ref 0.1–0.9)
Monocytes: 6 %
NEUTROS ABS: 4.7 10*3/uL (ref 1.4–7.0)
Neutrophils: 59 %
PLATELETS: 314 10*3/uL (ref 150–379)
RBC: 4.31 x10E6/uL (ref 3.77–5.28)
RDW: 13.7 % (ref 12.3–15.4)
WBC: 8.1 10*3/uL (ref 3.4–10.8)

## 2016-09-29 LAB — CMP14+EGFR
ALK PHOS: 59 IU/L (ref 39–117)
ALT: 15 IU/L (ref 0–32)
AST: 17 IU/L (ref 0–40)
Albumin/Globulin Ratio: 1.8 (ref 1.2–2.2)
Albumin: 4.4 g/dL (ref 3.5–5.5)
BUN/Creatinine Ratio: 20 (ref 9–23)
BUN: 14 mg/dL (ref 6–24)
Bilirubin Total: 0.4 mg/dL (ref 0.0–1.2)
CALCIUM: 9 mg/dL (ref 8.7–10.2)
CO2: 21 mmol/L (ref 18–29)
CREATININE: 0.69 mg/dL (ref 0.57–1.00)
Chloride: 102 mmol/L (ref 96–106)
GFR calc Af Amer: 119 mL/min/{1.73_m2} (ref >59)
GFR, EST NON AFRICAN AMERICAN: 103 mL/min/{1.73_m2} (ref >59)
GLOBULIN, TOTAL: 2.4 g/dL (ref 1.5–4.5)
GLUCOSE: 87 mg/dL (ref 65–99)
Potassium: 4.3 mmol/L (ref 3.5–5.2)
Sodium: 139 mmol/L (ref 134–144)
Total Protein: 6.8 g/dL (ref 6.0–8.5)

## 2016-09-29 LAB — LIPID PANEL
CHOL/HDL RATIO: 2.5 ratio (ref 0.0–4.4)
CHOLESTEROL TOTAL: 178 mg/dL (ref 100–199)
HDL: 72 mg/dL (ref >39)
LDL CALC: 94 mg/dL (ref 0–99)
TRIGLYCERIDES: 58 mg/dL (ref 0–149)
VLDL CHOLESTEROL CAL: 12 mg/dL (ref 5–40)

## 2016-09-29 LAB — THYROID PANEL WITH TSH
FREE THYROXINE INDEX: 2.9 (ref 1.2–4.9)
T3 Uptake Ratio: 24 % (ref 24–39)
T4, Total: 12.1 ug/dL — ABNORMAL HIGH (ref 4.5–12.0)
TSH: 1.1 u[IU]/mL (ref 0.450–4.500)

## 2016-11-11 ENCOUNTER — Other Ambulatory Visit: Payer: Self-pay | Admitting: Nurse Practitioner

## 2016-12-18 ENCOUNTER — Telehealth: Payer: Self-pay | Admitting: Nurse Practitioner

## 2016-12-25 NOTE — Telephone Encounter (Signed)
Will call her self to get scheduled

## 2016-12-31 IMAGING — CR DG CHEST 2V
2 series · 2 of 2 positions shown · non-contrast
Comparison: None.

CLINICAL DATA: Bronchitis and left chest pain

EXAM:
CHEST  2 VIEW

[view not recorded (1 of 2)]
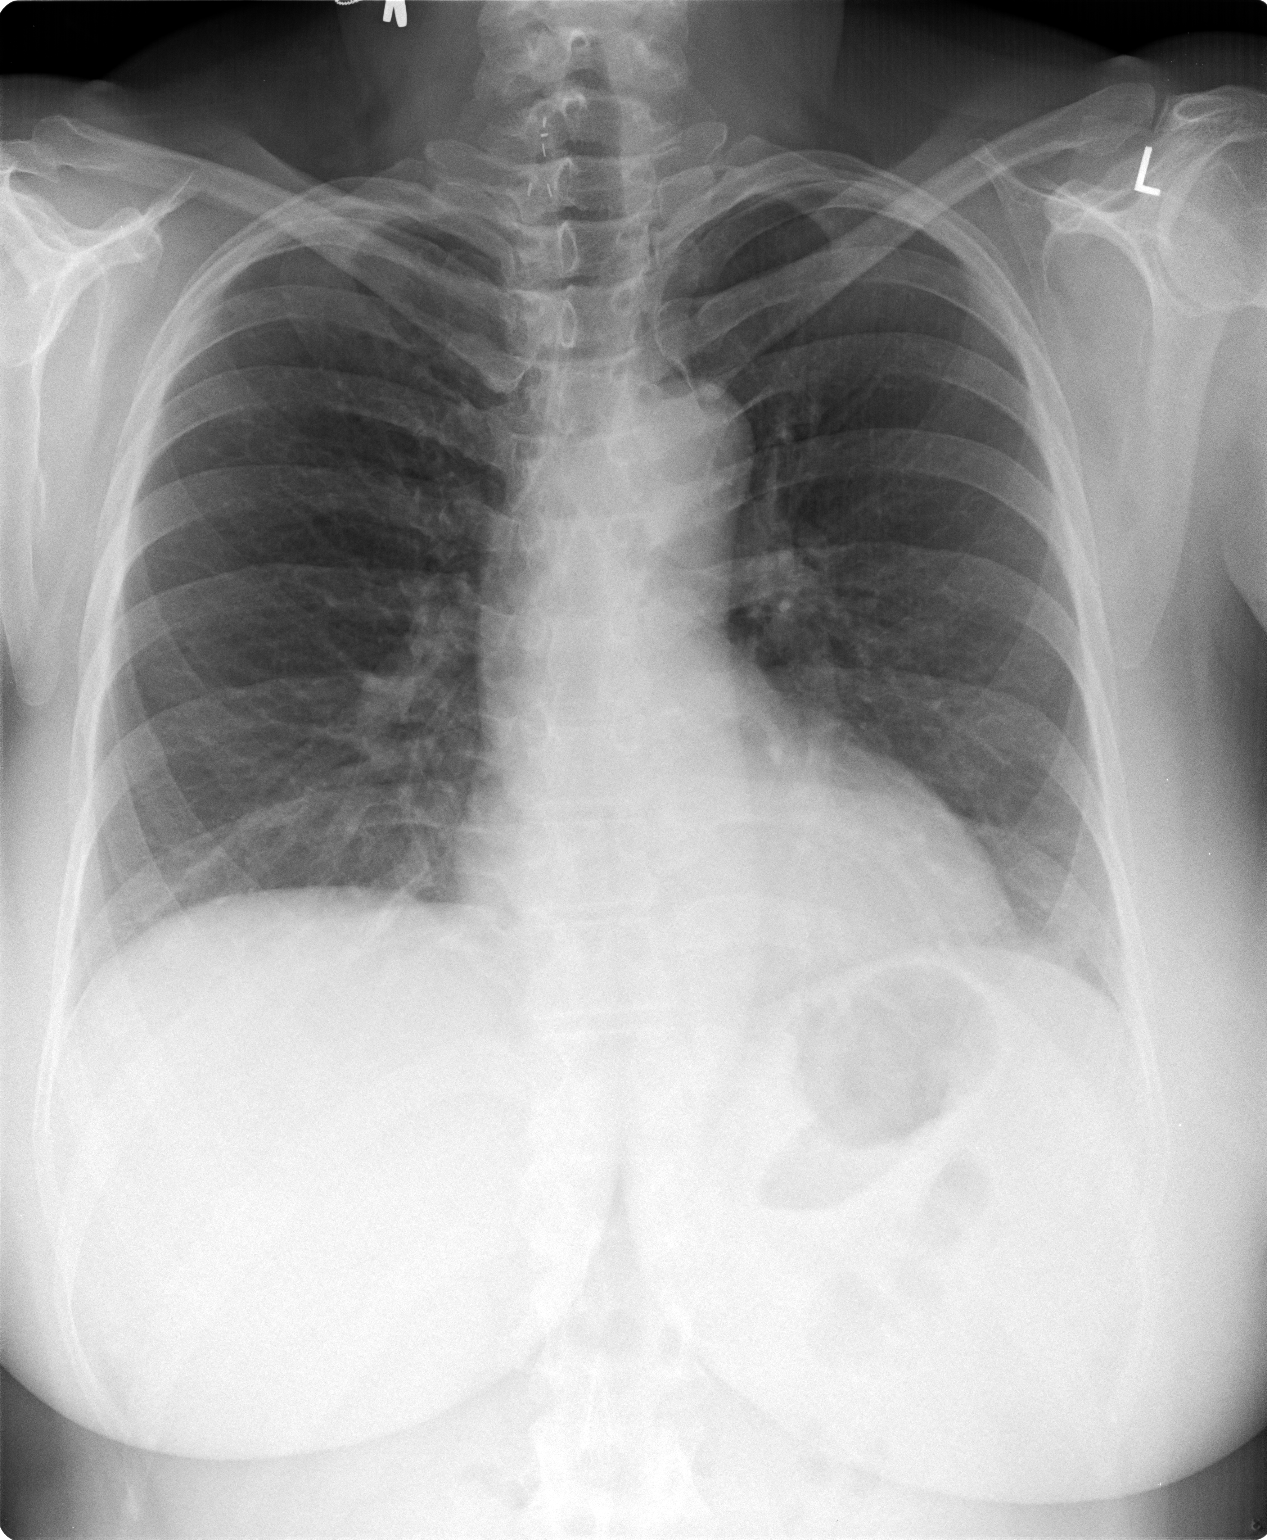

[view not recorded (2 of 2)]
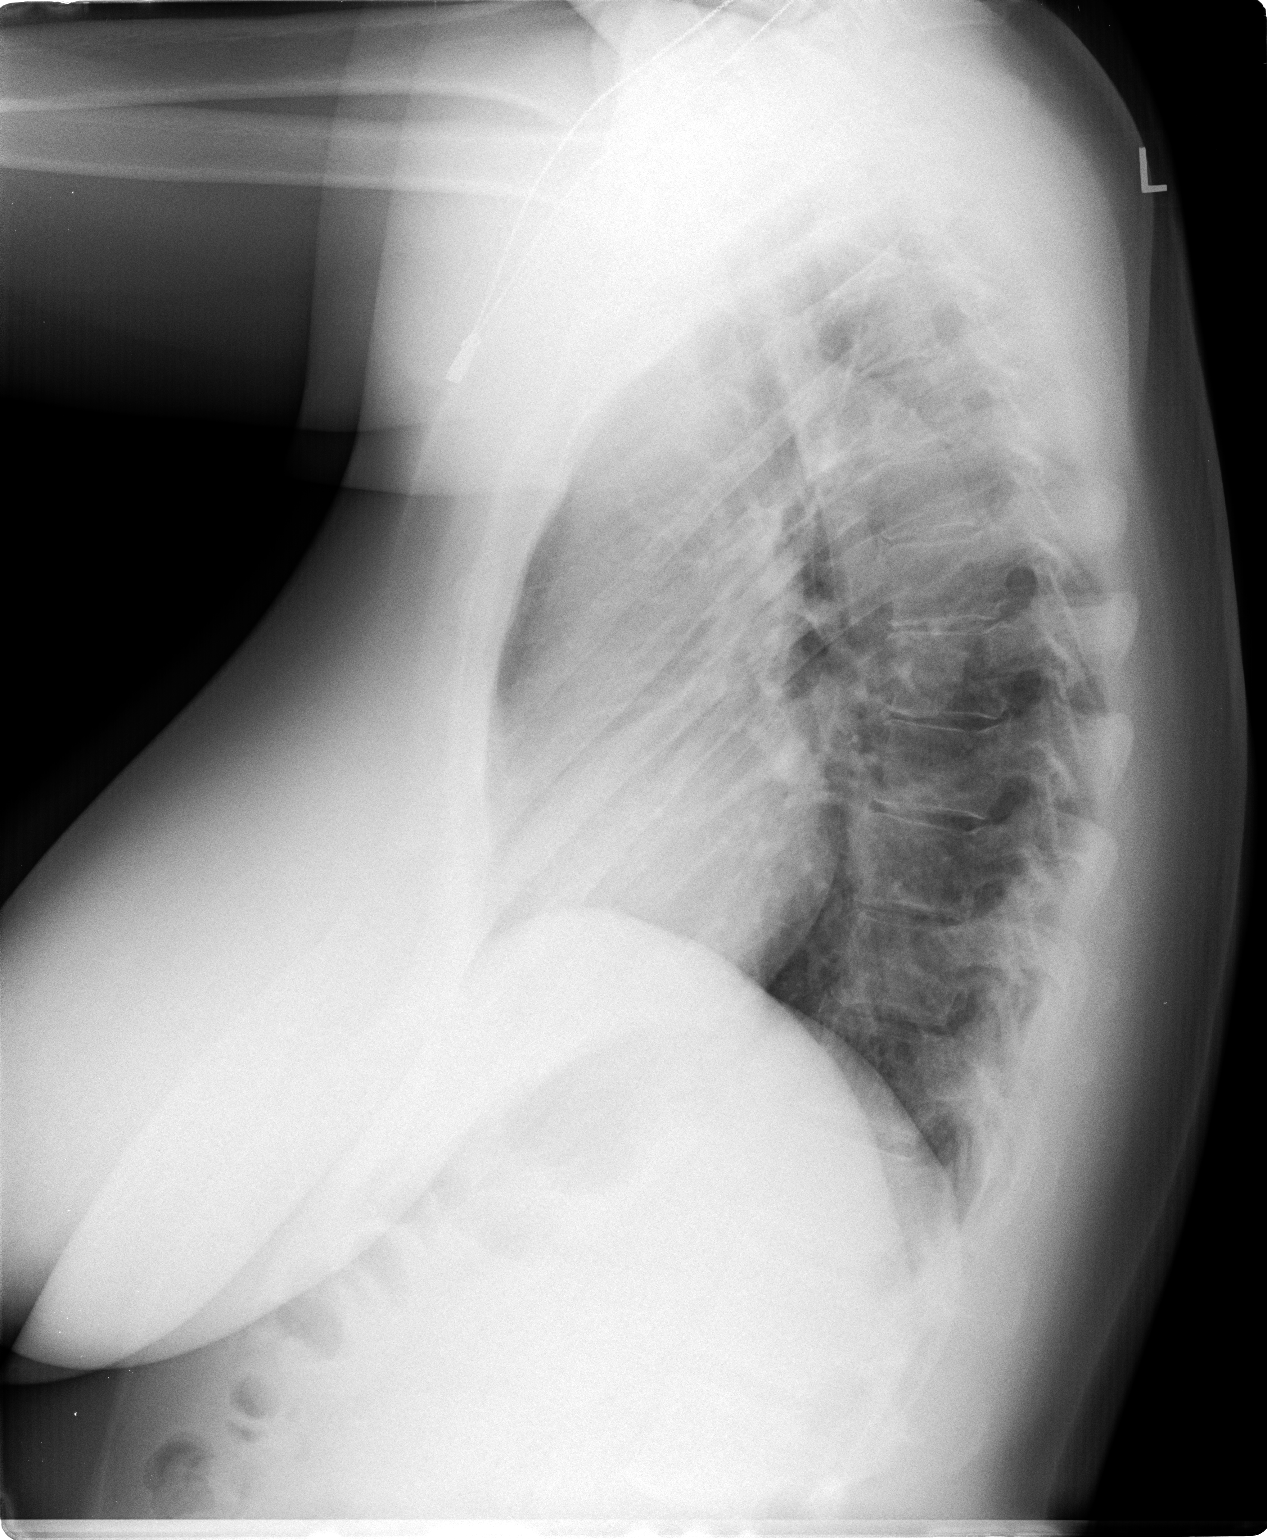

[2 of 2 positions shown; findings below may reference images not displayed]

FINDINGS: The heart is borderline enlarged. Lungs are under aerated and clear.
No pneumothorax. No pleural effusion. Bronchitic changes have a
chronic appearance.
IMPRESSION: No active cardiopulmonary disease.

## 2017-10-05 ENCOUNTER — Encounter: Payer: Self-pay | Admitting: Nurse Practitioner

## 2017-10-05 ENCOUNTER — Ambulatory Visit (INDEPENDENT_AMBULATORY_CARE_PROVIDER_SITE_OTHER): Payer: BC Managed Care – PPO | Admitting: Nurse Practitioner

## 2017-10-05 VITALS — BP 129/81 | HR 83 | Temp 97.4°F | Ht 65.0 in | Wt 198.0 lb

## 2017-10-05 DIAGNOSIS — Z6834 Body mass index (BMI) 34.0-34.9, adult: Secondary | ICD-10-CM | POA: Diagnosis not present

## 2017-10-05 DIAGNOSIS — Z0000 Encounter for general adult medical examination without abnormal findings: Secondary | ICD-10-CM

## 2017-10-05 DIAGNOSIS — M7711 Lateral epicondylitis, right elbow: Secondary | ICD-10-CM

## 2017-10-05 DIAGNOSIS — E039 Hypothyroidism, unspecified: Secondary | ICD-10-CM

## 2017-10-05 MED ORDER — LEVOTHYROXINE SODIUM 100 MCG PO TABS: 100.0000 ug | ORAL_TABLET | Freq: Every day | ORAL | 3 refills | Status: DC

## 2017-10-05 NOTE — Patient Instructions (Signed)
Tennis Elbow Tennis elbow is puffiness (inflammation) of the outer tendons of your forearm close to your elbow. Your tendons attach your muscles to your bones. Tennis elbow can happen in any sport or job in which you use your elbow too much. It is caused by doing the same motion over and over. Tennis elbow can cause:  Pain and tenderness in your forearm and the outer part of your elbow.  A burning feeling. This runs from your elbow through your arm.  Weak grip in your hands.  Follow these instructions at home: Activity  Rest your elbow and wrist as told by your doctor. Try to avoid any activities that caused the problem until your doctor says that you can do them again.  If a physical therapist teaches you exercises, do all of them as told.  If you lift an object, lift it with your palm facing up. This is easier on your elbow. Lifestyle  If your tennis elbow is caused by sports, check your equipment and make sure that: ? You are using it correctly. ? It fits you well.  If your tennis elbow is caused by work, take breaks often, if you are able. Talk with your manager about doing your work in a way that is safe for you. ? If your tennis elbow is caused by computer use, talk with your manager about any changes that can be made to your work setup. General instructions  If told, apply ice to the painful area: ? Put ice in a plastic bag. ? Place a towel between your skin and the bag. ? Leave the ice on for 20 minutes, 2-3 times per day.  Take medicines only as told by your doctor.  If you were given a brace, wear it as told by your doctor.  Keep all follow-up visits as told by your doctor. This is important. Contact a doctor if:  Your pain does not get better with treatment.  Your pain gets worse.  You have weakness in your forearm, hand, or fingers.  You cannot feel your forearm, hand, or fingers. This information is not intended to replace advice given to you by your health  care provider. Make sure you discuss any questions you have with your health care provider. Document Released: 03/29/2010 Document Revised: 06/08/2016 Document Reviewed: 10/05/2014 Elsevier Interactive Patient Education  2018 Elsevier Inc.  

## 2017-10-05 NOTE — Progress Notes (Signed)
   Subjective:    Patient ID: Leah May, female    DOB: 09-Aug-1966, 51 y.o.   MRN: [id number]  HPI  Leah May is here today for follow up of chronic medical problem.  Outpatient Encounter Medications as of 10/05/2017  Medication Sig  . levothyroxine (SYNTHROID, LEVOTHROID) 100 MCG tablet TAKE 1 TABLET DAILY     1. Annual physical exam   2. Hypothyroidism, unspecified type  No problems that she is aware of  3. BMI 34.0-34.9,adult  No recent weight changes    New complaints: Right lbow pain that started after thanksgiving- pain 8/10 with over use.  Social history: No longer works    Review of Systems  Constitutional: Negative for activity change and appetite change.  HENT: Negative.   Eyes: Negative for pain.  Respiratory: Negative for shortness of breath.   Cardiovascular: Negative for chest pain, palpitations and leg swelling.  Gastrointestinal: Negative for abdominal pain.  Endocrine: Negative for polydipsia.  Genitourinary: Negative.   Musculoskeletal: Positive for arthralgias (right elbow).  Skin: Negative for rash.  Neurological: Negative for dizziness, weakness and headaches.  Hematological: Does not bruise/bleed easily.  Psychiatric/Behavioral: Negative.   All other systems reviewed and are negative.      Objective:   Physical Exam  Constitutional: She is oriented to person, place, and time. She appears well-developed and well-nourished.  HENT:  Nose: Nose normal.  Mouth/Throat: Oropharynx is clear and moist.  Eyes: EOM are normal.  Neck: Trachea normal, normal range of motion and full passive range of motion without pain. Neck supple. No JVD present. Carotid bruit is not present. No thyromegaly present.  Cardiovascular: Normal rate, regular rhythm, normal heart sounds and intact distal pulses. Exam reveals no gallop and no friction rub.  No murmur heard. Pulmonary/Chest: Effort normal and breath sounds normal.  Abdominal: Soft. Bowel sounds are  normal. She exhibits no distension and no mass. There is no tenderness.  Musculoskeletal: Normal range of motion.  Pain lateral epicondyle of right elbow  Lymphadenopathy:    She has no cervical adenopathy.  Neurological: She is alert and oriented to person, place, and time. She has normal reflexes.  Skin: Skin is warm and dry.  Psychiatric: She has a normal mood and affect. Her behavior is normal. Judgment and thought content normal.   BP 129/81   Pulse 83   Temp (!) 97.4 F (36.3 C) (Oral)   Ht '5\' 5"'$  (1.651 m)   Wt 198 lb (89.8 kg)   BMI 32.95 kg/m         Assessment & Plan:  1. Annual physical exam - CBC with Differential/Platelet - CMP14+EGFR - Lipid panel  2. Hypothyroidism, unspecified type  - Thyroid Panel With TSH  3. BMI 34.0-34.9,adult Discussed diet and exercise for person with BMI >25 Will recheck weight in 3-6 months  4. Right lateral epicondylitis Tennis elbow strap Motrin OTC     Labs pending Health maintenance reviewed Diet and exercise encouraged Continue all meds Follow up  In 6 months Nye, FNP

## 2017-10-13 LAB — CBC WITH DIFFERENTIAL/PLATELET
BASOS: 0 %
Basophils Absolute: 0 10*3/uL (ref 0.0–0.2)
EOS (ABSOLUTE): 0.2 10*3/uL (ref 0.0–0.4)
Eos: 3 %
Hematocrit: 37.6 % (ref 34.0–46.6)
Hemoglobin: 12.5 g/dL (ref 11.1–15.9)
IMMATURE GRANS (ABS): 0.1 10*3/uL (ref 0.0–0.1)
IMMATURE GRANULOCYTES: 1 %
LYMPHS: 25 %
Lymphocytes Absolute: 2.2 10*3/uL (ref 0.7–3.1)
MCH: 29.1 pg (ref 26.6–33.0)
MCHC: 33.2 g/dL (ref 31.5–35.7)
MCV: 87 fL (ref 79–97)
MONOCYTES: 6 %
Monocytes Absolute: 0.5 10*3/uL (ref 0.1–0.9)
NEUTROS PCT: 65 %
Neutrophils Absolute: 5.6 10*3/uL (ref 1.4–7.0)
PLATELETS: 287 10*3/uL (ref 150–379)
RBC: 4.3 x10E6/uL (ref 3.77–5.28)
RDW: 15 % (ref 12.3–15.4)
WBC: 8.6 10*3/uL (ref 3.4–10.8)

## 2017-10-13 LAB — THYROID PANEL WITH TSH
Free Thyroxine Index: 2.4 (ref 1.2–4.9)
T3 Uptake Ratio: 21 % — ABNORMAL LOW (ref 24–39)
T4, Total: 11.3 ug/dL (ref 4.5–12.0)
TSH: 1.49 u[IU]/mL (ref 0.450–4.500)

## 2017-10-13 LAB — CMP14+EGFR
A/G RATIO: 1.8 (ref 1.2–2.2)
ALT: 18 IU/L (ref 0–32)
AST: 20 IU/L (ref 0–40)
Albumin: 4.4 g/dL (ref 3.5–5.5)
Alkaline Phosphatase: 63 IU/L (ref 39–117)
BUN/Creatinine Ratio: 21 (ref 9–23)
BUN: 13 mg/dL (ref 6–24)
Bilirubin Total: 0.3 mg/dL (ref 0.0–1.2)
CALCIUM: 9.1 mg/dL (ref 8.7–10.2)
CO2: 22 mmol/L (ref 20–29)
Chloride: 103 mmol/L (ref 96–106)
Creatinine, Ser: 0.61 mg/dL (ref 0.57–1.00)
GFR, EST AFRICAN AMERICAN: 123 mL/min/{1.73_m2} (ref >59)
GFR, EST NON AFRICAN AMERICAN: 107 mL/min/{1.73_m2} (ref >59)
GLUCOSE: 87 mg/dL (ref 65–99)
Globulin, Total: 2.5 g/dL (ref 1.5–4.5)
Potassium: 4.5 mmol/L (ref 3.5–5.2)
Sodium: 139 mmol/L (ref 134–144)
TOTAL PROTEIN: 6.9 g/dL (ref 6.0–8.5)

## 2017-10-13 LAB — LIPID PANEL
CHOL/HDL RATIO: 2.3 ratio (ref 0.0–4.4)
Cholesterol, Total: 175 mg/dL (ref 100–199)
HDL: 77 mg/dL (ref >39)
LDL CALC: 85 mg/dL (ref 0–99)
Triglycerides: 67 mg/dL (ref 0–149)
VLDL Cholesterol Cal: 13 mg/dL (ref 5–40)

## 2018-04-08 ENCOUNTER — Ambulatory Visit: Payer: BC Managed Care – PPO | Admitting: Nurse Practitioner

## 2018-08-06 LAB — HM MAMMOGRAPHY

## 2018-10-07 ENCOUNTER — Other Ambulatory Visit: Payer: Self-pay | Admitting: Nurse Practitioner

## 2018-10-08 ENCOUNTER — Other Ambulatory Visit: Payer: Self-pay | Admitting: Nurse Practitioner

## 2018-10-09 NOTE — Telephone Encounter (Signed)
Last thyroid 10/05/17

## 2018-10-10 NOTE — Telephone Encounter (Signed)
Last refill without being seen 

## 2019-04-07 ENCOUNTER — Other Ambulatory Visit: Payer: Self-pay | Admitting: Nurse Practitioner

## 2019-04-08 ENCOUNTER — Other Ambulatory Visit: Payer: Self-pay | Admitting: Nurse Practitioner

## 2019-04-30 ENCOUNTER — Other Ambulatory Visit: Payer: Self-pay

## 2019-05-01 ENCOUNTER — Encounter: Payer: Self-pay | Admitting: Nurse Practitioner

## 2019-05-01 ENCOUNTER — Ambulatory Visit: Payer: No Typology Code available for payment source | Admitting: Nurse Practitioner

## 2019-05-01 VITALS — BP 129/78 | HR 91 | Temp 98.1°F | Ht 65.0 in | Wt 193.0 lb

## 2019-05-01 DIAGNOSIS — E039 Hypothyroidism, unspecified: Secondary | ICD-10-CM

## 2019-05-01 DIAGNOSIS — Z6832 Body mass index (BMI) 32.0-32.9, adult: Secondary | ICD-10-CM

## 2019-05-01 NOTE — Progress Notes (Signed)
Subjective:    Patient ID: Leah May, female    DOB: [birth year], 53 y.o.   MRN: [id number]   Chief Complaint: Medical Management of Chronic Issues (Discuss changing dose)    HPI:  1. Hypothyroidism, unspecified type No problems that she is aware of. Her last TSH was 1.4. she has been taking her daughters levothyroxine 156mg dose instead of her 1077m dose. She says she feels better. Her hair is growing and she has finger nails that she has never had before.  2. BMI 34.0-34.9,adult Weight is down 5lbs from last visit    Outpatient Encounter Medications as of 05/01/2019  Medication Sig   levothyroxine (SYNTHROID) 100 MCG tablet Take 1 tablet by mouth once daily   No facility-administered encounter medications on file as of 05/01/2019.     Past Surgical History:  Procedure Laterality Date   THYROIDECTOMY, PARTIAL      Family History  Problem Relation Age of Onset   Atrial fibrillation Mother    Hypertension Mother    Heart disease Mother    Cancer Mother        thyroid    New complaints: None today  Social history: Lives with her husband. Has retired from work  Controlled substance contract: N/A    Review of Systems  Constitutional: Negative for activity change and appetite change.  HENT: Negative.   Eyes: Negative for pain.  Respiratory: Negative for shortness of breath.   Cardiovascular: Negative for chest pain, palpitations and leg swelling.  Gastrointestinal: Negative for abdominal pain.  Endocrine: Negative for polydipsia.  Genitourinary: Negative.   Skin: Negative for rash.  Neurological: Negative for dizziness, weakness and headaches.  Hematological: Does not bruise/bleed easily.  Psychiatric/Behavioral: Negative.   All other systems reviewed and are negative.      Objective:   Physical Exam Vitals signs and nursing note reviewed.  Constitutional:      General: She is not in acute distress.    Appearance: Normal appearance. She is  well-developed.  HENT:     Head: Normocephalic.     Nose: Nose normal.  Eyes:     Pupils: Pupils are equal, round, and reactive to light.  Neck:     Musculoskeletal: Normal range of motion and neck supple.     Vascular: No carotid bruit or JVD.  Cardiovascular:     Rate and Rhythm: Normal rate and regular rhythm.     Heart sounds: Normal heart sounds.  Pulmonary:     Effort: Pulmonary effort is normal. No respiratory distress.     Breath sounds: Normal breath sounds. No wheezing or rales.  Chest:     Chest wall: No tenderness.  Abdominal:     General: Bowel sounds are normal. There is no distension or abdominal bruit.     Palpations: Abdomen is soft. There is no hepatomegaly, splenomegaly, mass or pulsatile mass.     Tenderness: There is no abdominal tenderness.  Musculoskeletal: Normal range of motion.  Lymphadenopathy:     Cervical: No cervical adenopathy.  Skin:    General: Skin is warm and dry.  Neurological:     Mental Status: She is alert and oriented to person, place, and time.     Deep Tendon Reflexes: Reflexes are normal and symmetric.  Psychiatric:        Behavior: Behavior normal.        Thought Content: Thought content normal.        Judgment: Judgment normal.  BP 129/78    Pulse 91    Temp 98.1 F (36.7 C) (Oral)    Ht 5' 5"  (1.651 m)    Wt 193 lb (87.5 kg)    BMI 32.12 kg/m      Assessment & Plan:  Leah May comes in today with chief complaint of Medical Management of Chronic Issues (Discuss changing dose)   Diagnosis and orders addressed:  1. Hypothyroidism, unspecified type Will decide on dose once labs are back - CMP14+EGFR - Lipid panel - Thyroid Panel With TSH  2. BMI 32.0-32.9,adult Discussed diet and exercise for person with BMI >25 Will recheck weight in 3-6 months    Labs pending Health Maintenance reviewed Diet and exercise encouraged  Follow up plan: 6 months   Mary-Margaret Hassell Done, FNP

## 2019-05-01 NOTE — Patient Instructions (Signed)
Hypothyroidism  Hypothyroidism is when the thyroid gland does not make enough of certain hormones (it is underactive). The thyroid gland is a small gland located in the lower front part of the neck, just in front of the windpipe (trachea). This gland makes hormones that help control how the body uses food for energy (metabolism) as well as how the heart and brain function. These hormones also play a role in keeping your bones strong. When the thyroid is underactive, it produces too little of the hormones thyroxine (T4) and triiodothyronine (T3). What are the causes? This condition may be caused by:  Hashimoto's disease. This is a disease in which the body's disease-fighting system (immune system) attacks the thyroid gland. This is the most common cause.  Viral infections.  Pregnancy.  Certain medicines.  Birth defects.  Past radiation treatments to the head or neck for cancer.  Past treatment with radioactive iodine.  Past exposure to radiation in the environment.  Past surgical removal of part or all of the thyroid.  Problems with a gland in the center of the brain (pituitary gland).  Lack of enough iodine in the diet. What increases the risk? You are more likely to develop this condition if:  You are female.  You have a family history of thyroid conditions.  You use a medicine called lithium.  You take medicines that affect the immune system (immunosuppressants). What are the signs or symptoms? Symptoms of this condition include:  Feeling as though you have no energy (lethargy).  Not being able to tolerate cold.  Weight gain that is not explained by a change in diet or exercise habits.  Lack of appetite.  Dry skin.  Coarse hair.  Menstrual irregularity.  Slowing of thought processes.  Constipation.  Sadness or depression. How is this diagnosed? This condition may be diagnosed based on:  Your symptoms, your medical history, and a physical exam.  Blood  tests. You may also have imaging tests, such as an ultrasound or MRI. How is this treated? This condition is treated with medicine that replaces the thyroid hormones that your body does not make. After you begin treatment, it may take several weeks for symptoms to go away. Follow these instructions at home:  Take over-the-counter and prescription medicines only as told by your health care provider.  If you start taking any new medicines, tell your health care provider.  Keep all follow-up visits as told by your health care provider. This is important. ? As your condition improves, your dosage of thyroid hormone medicine may change. ? You will need to have blood tests regularly so that your health care provider can monitor your condition. Contact a health care provider if:  Your symptoms do not get better with treatment.  You are taking thyroid replacement medicine and you: ? Sweat a lot. ? Have tremors. ? Feel anxious. ? Lose weight rapidly. ? Cannot tolerate heat. ? Have emotional swings. ? Have diarrhea. ? Feel weak. Get help right away if you have:  Chest pain.  An irregular heartbeat.  A rapid heartbeat.  Difficulty breathing. Summary  Hypothyroidism is when the thyroid gland does not make enough of certain hormones (it is underactive).  When the thyroid is underactive, it produces too little of the hormones thyroxine (T4) and triiodothyronine (T3).  The most common cause is Hashimoto's disease, a disease in which the body's disease-fighting system (immune system) attacks the thyroid gland. The condition can also be caused by viral infections, medicine, pregnancy, or past   radiation treatment to the head or neck.  Symptoms may include weight gain, dry skin, constipation, feeling as though you do not have energy, and not being able to tolerate cold.  This condition is treated with medicine to replace the thyroid hormones that your body does not make. This information  is not intended to replace advice given to you by your health care provider. Make sure you discuss any questions you have with your health care provider. Document Released: 10/09/2005 Document Revised: 09/21/2017 Document Reviewed: 09/19/2017 Elsevier Patient Education  2020 Elsevier Inc.  

## 2019-05-02 LAB — CMP14+EGFR
ALT: 21 IU/L (ref 0–32)
AST: 20 IU/L (ref 0–40)
Albumin/Globulin Ratio: 2.1 (ref 1.2–2.2)
Albumin: 4.8 g/dL (ref 3.8–4.8)
Alkaline Phosphatase: 67 IU/L (ref 39–117)
BUN/Creatinine Ratio: 16 (ref 9–23)
BUN: 12 mg/dL (ref 6–24)
Bilirubin Total: 0.3 mg/dL (ref 0.0–1.2)
CO2: 22 mmol/L (ref 20–29)
Calcium: 10.2 mg/dL (ref 8.7–10.2)
Chloride: 101 mmol/L (ref 96–106)
Creatinine, Ser: 0.73 mg/dL (ref 0.57–1.00)
GFR calc Af Amer: 111 mL/min/{1.73_m2} (ref >59)
GFR calc non Af Amer: 96 mL/min/{1.73_m2} (ref >59)
Globulin, Total: 2.3 g/dL (ref 1.5–4.5)
Glucose: 92 mg/dL (ref 65–99)
Potassium: 4.2 mmol/L (ref 3.5–5.2)
Sodium: 140 mmol/L (ref 134–144)
Total Protein: 7.1 g/dL (ref 6.0–8.5)

## 2019-05-02 LAB — LIPID PANEL
Chol/HDL Ratio: 2.4 ratio (ref 0.0–4.4)
Cholesterol, Total: 183 mg/dL (ref 100–199)
HDL: 75 mg/dL (ref >39)
LDL Calculated: 88 mg/dL (ref 0–99)
Triglycerides: 102 mg/dL (ref 0–149)
VLDL Cholesterol Cal: 20 mg/dL (ref 5–40)

## 2019-05-02 LAB — THYROID PANEL WITH TSH
Free Thyroxine Index: 3 (ref 1.2–4.9)
T3 Uptake Ratio: 26 % (ref 24–39)
T4, Total: 11.4 ug/dL (ref 4.5–12.0)
TSH: 0.179 u[IU]/mL — ABNORMAL LOW (ref 0.450–4.500)

## 2019-05-05 MED ORDER — LEVOTHYROXINE SODIUM 125 MCG PO TABS: 125.0000 ug | ORAL_TABLET | Freq: Every day | ORAL | 1 refills | Status: DC

## 2019-05-05 NOTE — Addendum Note (Signed)
Addended by: Chevis Pretty on: 05/05/2019 02:27 PM   Modules accepted: Orders

## 2019-11-03 ENCOUNTER — Ambulatory Visit: Payer: Self-pay | Admitting: Nurse Practitioner

## 2019-11-18 ENCOUNTER — Other Ambulatory Visit: Payer: Self-pay | Admitting: Nurse Practitioner

## 2019-11-28 ENCOUNTER — Ambulatory Visit: Payer: Self-pay | Admitting: Nurse Practitioner

## 2019-12-08 ENCOUNTER — Encounter: Payer: Self-pay | Admitting: Nurse Practitioner

## 2019-12-09 ENCOUNTER — Telehealth (INDEPENDENT_AMBULATORY_CARE_PROVIDER_SITE_OTHER): Payer: 59 | Admitting: Family

## 2019-12-09 ENCOUNTER — Encounter: Payer: Self-pay | Admitting: Family

## 2019-12-09 ENCOUNTER — Encounter: Payer: Self-pay | Admitting: Nurse Practitioner

## 2019-12-09 DIAGNOSIS — R21 Rash and other nonspecific skin eruption: Secondary | ICD-10-CM | POA: Diagnosis not present

## 2019-12-09 DIAGNOSIS — M5441 Lumbago with sciatica, right side: Secondary | ICD-10-CM | POA: Diagnosis not present

## 2019-12-09 MED ORDER — PREDNISONE 10 MG (21) PO TBPK: ORAL_TABLET | ORAL | 0 refills | Status: DC

## 2019-12-09 NOTE — Progress Notes (Signed)
Virtual Visit via telephone Note Due to COVID-19 pandemic this visit was conducted virtually. This visit type was conducted due to national recommendations for restrictions regarding the COVID-19 Pandemic (e.g. social distancing, sheltering in place) in an effort to limit this patient's exposure and mitigate transmission in our community. All issues noted in this document were discussed and addressed.  A physical exam was not performed with this format.  I connected with Leah May on 12/09/19 at 2:20 pm by rash and verified that I am speaking with the correct person using two identifiers. Leah May is currently located at home and no one is currently with her during visit. The provider, Evelina Dun, FNP is located in their office at time of visit.  I discussed the limitations, risks, security and privacy concerns of performing an evaluation and management service by telephone and the availability of in person appointments. I also discussed with the patient that there may be a patient responsible charge related to this service. The patient expressed understanding and agreed to proceed.   History and Present Illness: Pt presents to the office today with a rash on her right inner thigh and lower back. She reports she started having low back pain on Wednesday that radiated down her right buttocks and thigh. She reports 10 out 10.  She was using a heating pad and noticed a red spot on her inner thigh Sunday. She thought it was from the heating pad, but has increased in size. She reports the rash is tingling. Denies any new products, chemicals, medications, foods, or insect bites. She states the area feels like needles. Rash This is a new problem. The current episode started in the past 7 days (two days ago). The problem has been gradually worsening since onset. Location: right thigh, lower back. The rash is characterized by pain and burning. She was exposed to nothing.  Back Pain This is a new  problem. The current episode started 1 to 4 weeks ago. The problem occurs intermittently. The problem has been waxing and waning since onset. The pain is present in the lumbar spine. The quality of the pain is described as aching. The pain is at a severity of 10/10. The pain is moderate. The symptoms are aggravated by lying down and standing. Associated symptoms include leg pain. Risk factors include obesity. She has tried heat, bed rest and ice for the symptoms. The treatment provided mild relief.      Review of Systems  Musculoskeletal: Positive for back pain.  Skin: Positive for rash.     Observations/Objective: No SOB or distress noted   Assessment and Plan: 1. Rash and nonspecific skin eruption - predniSONE (STERAPRED UNI-PAK 21 TAB) 10 MG (21) TBPK tablet; Use as directed  Dispense: 21 tablet; Refill: 0  2. Acute right-sided low back pain with right-sided sciatica - predniSONE (STERAPRED UNI-PAK 21 TAB) 10 MG (21) TBPK tablet; Use as directed  Dispense: 21 tablet; Refill: 0  Unsure if rash is related to some type of allergic reaction/contact dermatitis vs cellulitis.  Pt does not think it is contact dermatitis. Discussed if area worsens to call and we will start antibiotic. She was worried it was shingles, but I doubt this because rah on inner thigh and lower back.     I discussed the assessment and treatment plan with the patient. The patient was provided an opportunity to ask questions and all were answered. The patient agreed with the plan and demonstrated an understanding of the instructions.  The patient was advised to call back or seek an in-person evaluation if the symptoms worsen or if the condition fails to improve as anticipated.  The above assessment and management plan was discussed with the patient. The patient verbalized understanding of and has agreed to the management plan. Patient is aware to call the clinic if symptoms persist or worsen. Patient is aware when to  return to the clinic for a follow-up visit. Patient educated on when it is appropriate to go to the emergency department.   Time call ended:  2:42 pm  I provided 22 minutes of  face-to-face time during this encounter.    Evelina Dun, FNP

## 2019-12-10 ENCOUNTER — Ambulatory Visit: Payer: 59 | Admitting: Family Medicine

## 2019-12-10 ENCOUNTER — Telehealth: Payer: 59 | Admitting: Family Medicine

## 2019-12-10 ENCOUNTER — Telehealth: Payer: Self-pay | Admitting: Nurse Practitioner

## 2019-12-10 MED ORDER — VALACYCLOVIR HCL 1 G PO TABS: 1000.0000 mg | ORAL_TABLET | Freq: Two times a day (BID) | ORAL | 0 refills | Status: DC

## 2019-12-10 NOTE — Telephone Encounter (Signed)
FYI, sent you a message earlier about this patient.  She has now been scheduled for a video visit with Dr. Livia Snellen.

## 2019-12-10 NOTE — Telephone Encounter (Signed)
Duplicate request sent through [facility]

## 2019-12-10 NOTE — Telephone Encounter (Signed)
Patient aware that Valtrex has been sent in.  She has already cancelled video visit.

## 2019-12-10 NOTE — Telephone Encounter (Signed)
Pt called back regarding same issue. Was in tears over the phone because of the pain on her legs. Requested to make another appt. Scheduled pt to have video visit today with Dr Livia Snellen.

## 2019-12-10 NOTE — Telephone Encounter (Signed)
Okay thanks for the information, I do not know if the video visit will be sufficient based on the pictures that she sent. I did send in some Valtrex for her as a possibility of what is going on could be shingles, she can keep the video visit if she wants to but I did send in the Valtrex for her.

## 2019-12-10 NOTE — Telephone Encounter (Signed)
Pt called stating that she has been speaking with Christy regarding shingles on her legs. Says what looked like a rash on her right leg now has blisters. Pt says she starting the Prednisone Rx last night. Wants to know if an antibiotic or something can be sent to pharmacy for her. Says Alyse Low told her if symptoms got worse, that she could do that. (pt noted that she did request this through My Chart)

## 2020-01-06 ENCOUNTER — Other Ambulatory Visit: Payer: Self-pay

## 2020-01-06 ENCOUNTER — Ambulatory Visit (INDEPENDENT_AMBULATORY_CARE_PROVIDER_SITE_OTHER): Payer: 59 | Admitting: Nurse Practitioner

## 2020-01-06 ENCOUNTER — Encounter: Payer: Self-pay | Admitting: Nurse Practitioner

## 2020-01-06 VITALS — BP 133/81 | HR 90 | Temp 98.9°F | Resp 20 | Ht 65.0 in | Wt 171.0 lb

## 2020-01-06 DIAGNOSIS — N939 Abnormal uterine and vaginal bleeding, unspecified: Secondary | ICD-10-CM | POA: Diagnosis not present

## 2020-01-06 DIAGNOSIS — E039 Hypothyroidism, unspecified: Secondary | ICD-10-CM

## 2020-01-06 DIAGNOSIS — Z6834 Body mass index (BMI) 34.0-34.9, adult: Secondary | ICD-10-CM | POA: Diagnosis not present

## 2020-01-06 MED ORDER — MEDROXYPROGESTERONE ACETATE 10 MG PO TABS: 10.0000 mg | ORAL_TABLET | Freq: Every day | ORAL | 0 refills | Status: DC

## 2020-01-06 MED ORDER — LEVOTHYROXINE SODIUM 125 MCG PO TABS: 125.0000 ug | ORAL_TABLET | Freq: Every day | ORAL | 1 refills | Status: DC

## 2020-01-06 MED ORDER — VALACYCLOVIR HCL 1 G PO TABS: ORAL_TABLET | ORAL | Status: DC

## 2020-01-06 NOTE — Progress Notes (Signed)
Subjective:    Chief Complaint: Medical Management of Chronic Issues    HPI:  1. Acquired hypothyroidism No problems at this time. Lab Results  Component Value Date   TSH 0.179 (L) 05/01/2019     2. BMI 34.0-34.9,adult Has lost 22 pounds since last visit. Was using Noom and is now doing low carb/low sugar. Is walking 30 minutes every day and only drinking water. BMI Readings from Last 3 Encounters:  01/06/20 28.46 kg/m  05/01/19 32.12 kg/m  10/05/17 32.95 kg/m   Wt Readings from Last 3 Encounters:  01/06/20 171 lb (77.6 kg)  05/01/19 193 lb (87.5 kg)  10/05/17 198 lb (89.8 kg)       Outpatient Encounter Medications as of 01/06/2020  Medication Sig  . levothyroxine (EUTHYROX) 125 MCG tablet Take 1 tablet (125 mcg total) by mouth daily. Needs to be seen  . [DISCONTINUED] predniSONE (STERAPRED UNI-PAK 21 TAB) 10 MG (21) TBPK tablet Use as directed  . [DISCONTINUED] valACYclovir (VALTREX) 1000 MG tablet Take 1 tablet (1,000 mg total) by mouth 2 (two) times daily.   No facility-administered encounter medications on file as of 01/06/2020.    Past Surgical History:  Procedure Laterality Date  . THYROIDECTOMY, PARTIAL      Family History  Problem Relation Age of Onset  . Atrial fibrillation Mother   . Hypertension Mother   . Heart disease Mother   . Cancer Mother        thyroid    New complaints: Has been having menstrual bleeding for 3 weeks straight. Is having a heavy flow daily. Normally has a heavy flow but bleeding usually only lasts for about 7 days with 3 days that are a heavy flow. Had no period for June-August 2020. Had normal cycle September through January. Bleeding started February 23 and has not stopped. Is using 6-7 tampons a day. Denies cramping or any other associated symptoms.  Had shingles outbreak February 16 and is still taking valtrex for that. Blisters have resolved but is still having mild pain in the right leg.  Social history: Lives  with husband and 2 adult children.   Controlled substance contract: N/A      Review of Systems  Constitutional: Negative.   HENT: Negative.   Eyes: Negative.   Respiratory: Negative.   Cardiovascular: Negative.   Gastrointestinal: Negative.   Endocrine: Negative.   Genitourinary: Positive for menstrual problem (bleeding for 3 weeks straight).  Musculoskeletal: Negative.   Skin: Negative.   Allergic/Immunologic: Negative.   Neurological: Negative.   Hematological: Negative.   Psychiatric/Behavioral: Negative.        Objective:   Physical Exam Vitals and nursing note reviewed. Chaperone present: no exam done.  Constitutional:      Appearance: Normal appearance. She is normal weight.  HENT:     Head: Normocephalic.     Right Ear: Tympanic membrane normal.     Left Ear: Tympanic membrane normal.     Nose: Nose normal.     Mouth/Throat:     Mouth: Mucous membranes are moist.     Pharynx: Oropharynx is clear.  Eyes:     Conjunctiva/sclera: Conjunctivae normal.  Cardiovascular:     Rate and Rhythm: Normal rate and regular rhythm.     Pulses: Normal pulses.     Heart sounds: Normal heart sounds.  Pulmonary:     Effort: Pulmonary effort is normal.     Breath sounds: Normal breath sounds.  Abdominal:     General: Bowel sounds  are normal.     Palpations: Abdomen is soft.  Musculoskeletal:        General: Normal range of motion.     Cervical back: Normal range of motion and neck supple.  Skin:    General: Skin is warm and dry.  Neurological:     Mental Status: She is alert and oriented to person, place, and time.  Psychiatric:        Behavior: Behavior normal.   BP 133/81   Pulse 90   Temp 98.9 F (37.2 C) (Temporal)   Resp 20   Ht 5' 5"  (1.651 m)   Wt 171 lb (77.6 kg)   SpO2 96%   BMI 28.46 kg/m        Assessment & Plan:   ILONA COLLEY comes in today with chief complaint of Medical Management of Chronic Issues   Diagnosis and orders  addressed:  1. Acquired hypothyroidism - levothyroxine (EUTHYROX) 125 MCG tablet; Take 1 tablet (125 mcg total) by mouth daily. Needs to be seen  Dispense: 90 tablet; Refill: 1 - CMP14+EGFR - Lipid panel - CBC with Differential/Platelet - Thyroid Panel With TSH  2. BMI 34.0-34.9,adult Discussed diet and exercise for person with BMI >25 Will recheck weight in 3-6 months   3. Abnormal uterine bleeding - medroxyPROGESTERone (PROVERA) 10 MG tablet; Take 1 tablet (10 mg total) by mouth daily.  Dispense: 10 tablet; Refill: 0   Labs pending Health Maintenance reviewed Diet and exercise encouraged  Follow up plan: 6 months   Mary-Margaret Hassell Done, FNP

## 2020-01-06 NOTE — Patient Instructions (Signed)
Abnormal Uterine Bleeding °Abnormal uterine bleeding means bleeding more than usual from your uterus. It can include: °· Bleeding between periods. °· Bleeding after sex. °· Bleeding that is heavier than normal. °· Periods that last longer than usual. °· Bleeding after you have stopped having your period (menopause). °There are many problems that may cause this. You should see a doctor for any kind of bleeding that is not normal. Treatment depends on the cause of the bleeding. °Follow these instructions at home: °· Watch your condition for any changes. °· Do not use tampons, douche, or have sex, if your doctor tells you not to. °· Change your pads often. °· Get regular well-woman exams. Make sure they include a pelvic exam and cervical cancer screening. °· Keep all follow-up visits as told by your doctor. This is important. °Contact a doctor if: °· The bleeding lasts more than one week. °· You feel dizzy at times. °· You feel like you are going to throw up (nauseous). °· You throw up. °Get help right away if: °· You pass out. °· You have to change pads every hour. °· You have belly (abdominal) pain. °· You have a fever. °· You get sweaty. °· You get weak. °· You passing large blood clots from your vagina. °Summary °· Abnormal uterine bleeding means bleeding more than usual from your uterus. °· There are many problems that may cause this. You should see a doctor for any kind of bleeding that is not normal. °· Treatment depends on the cause of the bleeding. °This information is not intended to replace advice given to you by your health care provider. Make sure you discuss any questions you have with your health care provider. °Document Revised: 10/03/2016 Document Reviewed: 10/03/2016 °Elsevier Patient Education © 2020 [employer] ° °

## 2020-01-07 LAB — CBC WITH DIFFERENTIAL/PLATELET
Basophils Absolute: 0 10*3/uL (ref 0.0–0.2)
Basos: 1 %
EOS (ABSOLUTE): 0.2 10*3/uL (ref 0.0–0.4)
Eos: 2 %
Hematocrit: 36.2 % (ref 34.0–46.6)
Hemoglobin: 12.1 g/dL (ref 11.1–15.9)
Immature Grans (Abs): 0 10*3/uL (ref 0.0–0.1)
Immature Granulocytes: 0 %
Lymphocytes Absolute: 2.4 10*3/uL (ref 0.7–3.1)
Lymphs: 30 %
MCH: 29.5 pg (ref 26.6–33.0)
MCHC: 33.4 g/dL (ref 31.5–35.7)
MCV: 88 fL (ref 79–97)
Monocytes Absolute: 0.6 10*3/uL (ref 0.1–0.9)
Monocytes: 7 %
Neutrophils Absolute: 4.8 10*3/uL (ref 1.4–7.0)
Neutrophils: 60 %
Platelets: 328 10*3/uL (ref 150–450)
RBC: 4.1 x10E6/uL (ref 3.77–5.28)
RDW: 13.8 % (ref 11.7–15.4)
WBC: 7.9 10*3/uL (ref 3.4–10.8)

## 2020-01-07 LAB — CMP14+EGFR
ALT: 13 IU/L (ref 0–32)
AST: 11 IU/L (ref 0–40)
Albumin/Globulin Ratio: 1.7 (ref 1.2–2.2)
Albumin: 4.2 g/dL (ref 3.8–4.9)
Alkaline Phosphatase: 64 IU/L (ref 39–117)
BUN/Creatinine Ratio: 33 — ABNORMAL HIGH (ref 9–23)
BUN: 16 mg/dL (ref 6–24)
Bilirubin Total: 0.2 mg/dL (ref 0.0–1.2)
CO2: 23 mmol/L (ref 20–29)
Calcium: 9.4 mg/dL (ref 8.7–10.2)
Chloride: 103 mmol/L (ref 96–106)
Creatinine, Ser: 0.49 mg/dL — ABNORMAL LOW (ref 0.57–1.00)
GFR calc Af Amer: 130 mL/min/{1.73_m2} (ref >59)
GFR calc non Af Amer: 113 mL/min/{1.73_m2} (ref >59)
Globulin, Total: 2.5 g/dL (ref 1.5–4.5)
Glucose: 92 mg/dL (ref 65–99)
Potassium: 4.3 mmol/L (ref 3.5–5.2)
Sodium: 138 mmol/L (ref 134–144)
Total Protein: 6.7 g/dL (ref 6.0–8.5)

## 2020-01-07 LAB — LIPID PANEL
Chol/HDL Ratio: 2.5 ratio (ref 0.0–4.4)
Cholesterol, Total: 194 mg/dL (ref 100–199)
HDL: 77 mg/dL (ref >39)
LDL Chol Calc (NIH): 102 mg/dL — ABNORMAL HIGH (ref 0–99)
Triglycerides: 81 mg/dL (ref 0–149)
VLDL Cholesterol Cal: 15 mg/dL (ref 5–40)

## 2020-01-07 LAB — THYROID PANEL WITH TSH
Free Thyroxine Index: 3 (ref 1.2–4.9)
T3 Uptake Ratio: 24 % (ref 24–39)
T4, Total: 12.7 ug/dL — ABNORMAL HIGH (ref 4.5–12.0)
TSH: 0.645 u[IU]/mL (ref 0.450–4.500)

## 2020-02-20 ENCOUNTER — Other Ambulatory Visit: Payer: Self-pay | Admitting: Nurse Practitioner

## 2020-07-04 ENCOUNTER — Other Ambulatory Visit: Payer: Self-pay | Admitting: Nurse Practitioner

## 2020-07-04 DIAGNOSIS — E039 Hypothyroidism, unspecified: Secondary | ICD-10-CM

## 2020-09-15 NOTE — Progress Notes (Signed)
Mm 11/

## 2021-01-01 ENCOUNTER — Other Ambulatory Visit: Payer: Self-pay | Admitting: Nurse Practitioner

## 2021-01-01 DIAGNOSIS — E039 Hypothyroidism, unspecified: Secondary | ICD-10-CM

## 2022-02-15 ENCOUNTER — Encounter: Payer: Self-pay | Admitting: Gastroenterology

## 2022-03-10 ENCOUNTER — Ambulatory Visit (AMBULATORY_SURGERY_CENTER): Payer: Commercial Managed Care - PPO | Admitting: *Deleted

## 2022-03-10 VITALS — Ht 66.0 in | Wt 185.0 lb

## 2022-03-10 DIAGNOSIS — Z1211 Encounter for screening for malignant neoplasm of colon: Secondary | ICD-10-CM

## 2022-03-10 NOTE — Progress Notes (Signed)
No egg or soy allergy known to patient  ?No issues known to pt with past sedation with any surgeries or procedures ?Patient denies ever being told they had issues or difficulty with intubation  ?No FH of Malignant Hyperthermia ?Pt is not on diet pills ?Pt is not on  home 02  ?Pt is not on blood thinners  ?Pt denies issues with constipation  ?No A fib or A flutter ? ? ?PV completed over the phone. Pt verified name, DOB, address and insurance during PV today.  ?Pt mailed instruction packet with copy of consent form to read and not return, and instructions.  ?Pt encouraged to call with questions or issues.  ?If pt has My chart, procedure instructions sent via My Chart  ?Insurance confirmed with pt at PV today   ?

## 2022-03-29 ENCOUNTER — Encounter: Payer: Self-pay | Admitting: Gastroenterology

## 2022-04-03 ENCOUNTER — Encounter: Payer: Self-pay | Admitting: Gastroenterology

## 2022-04-03 ENCOUNTER — Ambulatory Visit (AMBULATORY_SURGERY_CENTER): Payer: Commercial Managed Care - PPO | Admitting: Gastroenterology

## 2022-04-03 VITALS — BP 119/66 | HR 67 | Temp 97.1°F | Resp 16 | Ht 66.0 in | Wt 185.0 lb

## 2022-04-03 DIAGNOSIS — D120 Benign neoplasm of cecum: Secondary | ICD-10-CM

## 2022-04-03 DIAGNOSIS — Z1211 Encounter for screening for malignant neoplasm of colon: Secondary | ICD-10-CM

## 2022-04-03 MED ORDER — SODIUM CHLORIDE 0.9 % IV SOLN: 500.0000 mL | Freq: Once | INTRAVENOUS | Status: DC

## 2022-04-03 NOTE — Progress Notes (Signed)
Pt's states no medical or surgical changes since previsit or office visit. 

## 2022-04-03 NOTE — Progress Notes (Signed)
Called to room to assist during endoscopic procedure.  Patient ID and intended procedure confirmed with present staff. Received instructions for my participation in the procedure from the performing physician.  

## 2022-04-03 NOTE — Patient Instructions (Signed)
Handouts given for polyps, hemorrhoids, high fiber diet and diverticulosis.  Await pathology results.   YOU HAD AN ENDOSCOPIC PROCEDURE TODAY AT [facility]:   Refer to the procedure report that was given to you for any specific questions about what was found during the examination.  If the procedure report does not answer your questions, please call your gastroenterologist to clarify.  If you requested that your care partner not be given the details of your procedure findings, then the procedure report has been included in a sealed envelope for you to review at your convenience later.  YOU SHOULD EXPECT: Some feelings of bloating in the abdomen. Passage of more gas than usual.  Walking can help get rid of the air that was put into your GI tract during the procedure and reduce the bloating. If you had a lower endoscopy (such as a colonoscopy or flexible sigmoidoscopy) you may notice spotting of blood in your stool or on the toilet paper. If you underwent a bowel prep for your procedure, you may not have a normal bowel movement for a few days.  Please Note:  You might notice some irritation and congestion in your nose or some drainage.  This is from the oxygen used during your procedure.  There is no need for concern and it should clear up in a day or so.  SYMPTOMS TO REPORT IMMEDIATELY:  Following lower endoscopy (colonoscopy or flexible sigmoidoscopy):  Excessive amounts of blood in the stool  Significant tenderness or worsening of abdominal pains  Swelling of the abdomen that is new, acute  Fever of 100F or higher  For urgent or emergent issues, a gastroenterologist can be reached at any hour by calling [phone]. Do not use MyChart messaging for urgent concerns.    DIET:  We do recommend a small meal at first, but then you may proceed to your regular diet.  Drink plenty of fluids but you should avoid alcoholic beverages for 24 hours.  ACTIVITY:  You should plan to take  it easy for the rest of today and you should NOT DRIVE or use heavy machinery until tomorrow (because of the sedation medicines used during the test).    FOLLOW UP: Our staff will call the number listed on your records 24-72 hours following your procedure to check on you and address any questions or concerns that you may have regarding the information given to you following your procedure. If we do not reach you, we will leave a message.  We will attempt to reach you two times.  During this call, we will ask if you have developed any symptoms of COVID 19. If you develop any symptoms (ie: fever, flu-like symptoms, shortness of breath, cough etc.) before then, please call [phone].  If you test positive for Covid 19 in the 2 weeks post procedure, please call and report this information to [place].    If any biopsies were taken you will be contacted by phone or by letter within the next 1-3 weeks.  Please call us at [phone] if you have not heard about the biopsies in 3 weeks.    SIGNATURES/CONFIDENTIALITY: You and/or your care partner have signed paperwork which will be entered into your electronic medical record.  These signatures attest to the fact that that the information above on your After Visit Summary has been reviewed and is understood.  Full responsibility of the confidentiality of this discharge information lies with you and/or your care-partner.

## 2022-04-03 NOTE — Progress Notes (Signed)
To PACU, VSS. Report to [email or web address] 

## 2022-04-03 NOTE — Progress Notes (Signed)
   Referring Provider: Chevis Pretty, * Primary Care Physician:  Chevis Pretty, FNP  Indication for Procedure:  Colon cancer screening   IMPRESSION:  Need for colon cancer screening Appropriate candidate for monitored anesthesia care  PLAN: Colonoscopy in the [place] today   HPI: Leah May is a 56 y.o. female presents for screening colonoscopy.  No prior colonoscopy or colon cancer screening.  No baseline GI symptoms.   No known family history of colon cancer or polyps. No family history of uterine/endometrial cancer, pancreatic cancer or gastric/stomach cancer.   Past Medical History:  Diagnosis Date   Allergy    seasonal   Thyroid disease    HYPOTHYROIDISM    Past Surgical History:  Procedure Laterality Date   DILATION AND CURETTAGE OF UTERUS     removed 1 polyp - benign - done in the office with sedation   THYROIDECTOMY, PARTIAL  1991    Current Outpatient Medications  Medication Sig Dispense Refill   Ascorbic Acid (VITAMIN C PO) Take by mouth.     cetirizine (ZYRTEC) 10 MG tablet Take 10 mg by mouth daily.     Cholecalciferol (VITAMIN D-3 PO) Take by mouth.     Cyanocobalamin (VITAMIN B 12 PO) Take by mouth.     fluticasone (FLONASE) 50 MCG/ACT nasal spray 2 sprays each nostril daily as needed (Patient not taking: Reported on 03/10/2022)     levothyroxine (EUTHYROX) 125 MCG tablet Take 1 tablet (125 mcg total) by mouth daily before breakfast. (NEEDS TO BE SEEN BEFORE NEXT REFILL) 30 tablet 0   Nutritional Supplements (ESTROVEN PO) Take by mouth.     Probiotic Product (PROBIOTIC PO) Take by mouth.     No current facility-administered medications for this visit.    Allergies as of 04/03/2022   (No Known Allergies)    Family History  Problem Relation Age of Onset   Colon polyps Mother    Atrial fibrillation Mother    Hypertension Mother    Heart disease Mother    Cancer Mother        thyroid   Breast cancer Paternal Aunt    Colon  polyps Maternal Grandfather    Colon cancer Neg Hx    Esophageal cancer Neg Hx    Rectal cancer Neg Hx    Stomach cancer Neg Hx      Physical Exam: General:   Alert,  well-nourished, pleasant and cooperative in NAD Head:  Normocephalic and atraumatic. Eyes:  Sclera clear, no icterus.   Conjunctiva pink. Mouth:  No deformity or lesions.   Neck:  Supple; no masses or thyromegaly. Lungs:  Clear throughout to auscultation.   No wheezes. Heart:  Regular rate and rhythm; no murmurs. Abdomen:  Soft, non-tender, nondistended, normal bowel sounds, no rebound or guarding.  Msk:  Symmetrical. No boney deformities LAD: No inguinal or umbilical LAD Extremities:  No clubbing or edema. Neurologic:  Alert and  oriented x4;  grossly nonfocal Skin:  No obvious rash or bruise. Psych:  Alert and cooperative. Normal mood and affect.     Studies/Results: No results found.    Rebbie Lauricella L. Tarri Glenn, MD, MPH 04/03/2022, 8:28 AM

## 2022-04-03 NOTE — Op Note (Signed)
[facility] Patient Name: Leah May Procedure Date: 04/03/2022 9:25 AM MRN: [id number] Endoscopist: Thornton Park MD, MD Age: 56 Referring MD:  Date of Birth: 1966-07-17 Gender: Female Account #: [id number] Procedure:                Colonoscopy Indications:              Screening for colorectal malignant neoplasm, This                            is the patient's first colonoscopy                           No known family history of colon cancer or polyps Medicines:                Monitored Anesthesia Care Procedure:                Pre-Anesthesia Assessment:                           - Prior to the procedure, a History and Physical                            was performed, and patient medications and                            allergies were reviewed. The patient's tolerance of                            previous anesthesia was also reviewed. The risks                            and benefits of the procedure and the sedation                            options and risks were discussed with the patient.                            All questions were answered, and informed consent                            was obtained. Prior Anticoagulants: The patient has                            taken no previous anticoagulant or antiplatelet                            agents. ASA Grade Assessment: II - A patient with                            mild systemic disease. After reviewing the risks                            and benefits, the patient was deemed in  satisfactory condition to undergo the procedure.                           After obtaining informed consent, the colonoscope                            was passed under direct vision. Throughout the                            procedure, the patient's blood pressure, pulse, and                            oxygen saturations were monitored continuously. The                            CF [id number] #[id number] was  introduced through the anus                            and advanced to the 3 cm into the ileum. A second                            forward view of the right colon was performed. The                            colonoscopy was performed without difficulty. The                            patient tolerated the procedure well. The quality                            of the bowel preparation was good. The terminal                            ileum, ileocecal valve, appendiceal orifice, and                            rectum were photographed. Scope In: 9:40:38 AM Scope Out: 9:53:03 AM Scope Withdrawal Time: 0 hours 9 minutes 37 seconds  Total Procedure Duration: 0 hours 12 minutes 25 seconds  Findings:                 The perianal and digital rectal examinations were                            normal.                           Non-bleeding internal hemorrhoids were found.                           A few small and large-mouthed diverticula were                            found in the sigmoid colon.  Two sessile polyps were found in the cecum. The                            polyps were 1-2 mm in size. These polyps were                            removed with a cold snare. Resection and retrieval                            were complete. Estimated blood loss was minimal.                           The exam was otherwise without abnormality on                            direct and retroflexion views. Complications:            No immediate complications. Estimated Blood Loss:     Estimated blood loss was minimal. Impression:               - Non-bleeding internal hemorrhoids.                           - Diverticulosis in the sigmoid colon.                           - Two 1-2 mm polyps in the cecum, removed with a                            cold snare. Resected and retrieved.                           - The examination was otherwise normal on direct                             and retroflexion views. Recommendation:           - Patient has a contact number available for                            emergencies. The signs and symptoms of potential                            delayed complications were discussed with the                            patient. Return to normal activities tomorrow.                            Written discharge instructions were provided to the                            patient.                           - High fiber  diet.                           - Continue present medications.                           - Await pathology results.                           - Repeat colonoscopy date to be determined after                            pending pathology results are reviewed for                            surveillance.                           - Emerging evidence supports eating a diet of                            fruits, vegetables, grains, calcium, and yogurt                            while reducing red meat and alcohol may reduce the                            risk of colon cancer.                           - Thank you for allowing me to be involved in your                            colon cancer prevention. Thornton Park MD, MD 04/03/2022 9:56:53 AM This report has been signed electronically.

## 2022-04-04 ENCOUNTER — Telehealth: Payer: Self-pay

## 2022-04-04 NOTE — Telephone Encounter (Signed)
  Follow up Call-     04/03/2022    8:47 AM  Call back number  Post procedure Call Back phone  # [phone]  Permission to leave phone message Yes     Patient questions:  Do you have a fever, pain , or abdominal swelling? No. Pain Score  0 *  Have you tolerated food without any problems? Yes.    Have you been able to return to your normal activities? Yes.    Do you have any questions about your discharge instructions: Diet   No. Medications  No. Follow up visit  No.  Do you have questions or concerns about your Care? No.  Actions: * If pain score is 4 or above: No action needed, pain <4.

## 2022-04-07 ENCOUNTER — Encounter: Payer: Self-pay | Admitting: Gastroenterology

## 2022-10-24 ENCOUNTER — Other Ambulatory Visit: Payer: Self-pay | Admitting: Nurse Practitioner

## 2022-10-24 DIAGNOSIS — E039 Hypothyroidism, unspecified: Secondary | ICD-10-CM

## 2022-11-01 ENCOUNTER — Ambulatory Visit
Admission: RE | Admit: 2022-11-01 | Discharge: 2022-11-01 | Disposition: A | Discharge status: Home / Self Care | Payer: 59 | Source: Ambulatory Visit | Attending: Nurse Practitioner | Admitting: Nurse Practitioner

## 2022-11-01 DIAGNOSIS — E039 Hypothyroidism, unspecified: Secondary | ICD-10-CM

## 2023-05-16 ENCOUNTER — Other Ambulatory Visit: Payer: Self-pay | Admitting: Nurse Practitioner

## 2023-05-16 DIAGNOSIS — E041 Nontoxic single thyroid nodule: Secondary | ICD-10-CM

## 2023-05-30 ENCOUNTER — Ambulatory Visit
Admission: RE | Admit: 2023-05-30 | Discharge: 2023-05-30 | Disposition: A | Discharge status: Home / Self Care | Payer: 59 | Source: Ambulatory Visit | Attending: Nurse Practitioner | Admitting: Nurse Practitioner

## 2023-05-30 DIAGNOSIS — E041 Nontoxic single thyroid nodule: Secondary | ICD-10-CM

## 2024-05-29 ENCOUNTER — Other Ambulatory Visit: Payer: Self-pay | Admitting: Nurse Practitioner

## 2024-05-29 DIAGNOSIS — E041 Nontoxic single thyroid nodule: Secondary | ICD-10-CM

## 2024-06-02 ENCOUNTER — Ambulatory Visit
Admission: RE | Admit: 2024-06-02 | Discharge: 2024-06-02 | Disposition: A | Discharge status: Home / Self Care | Source: Ambulatory Visit | Attending: Nurse Practitioner

## 2024-06-02 DIAGNOSIS — E041 Nontoxic single thyroid nodule: Secondary | ICD-10-CM
